# Patient Record
Sex: Female | Born: 1975 | Hispanic: No | Marital: Single | Smoking: Never smoker
Health system: Southern US, Community
[De-identification: ages and names within clinical notes are randomized; demographics above are authoritative.]

## PROBLEM LIST (undated history)

## (undated) DIAGNOSIS — N939 Abnormal uterine and vaginal bleeding, unspecified: Secondary | ICD-10-CM

## (undated) DIAGNOSIS — R002 Palpitations: Secondary | ICD-10-CM

## (undated) DIAGNOSIS — B009 Herpesviral infection, unspecified: Secondary | ICD-10-CM

## (undated) DIAGNOSIS — N83209 Unspecified ovarian cyst, unspecified side: Secondary | ICD-10-CM

## (undated) DIAGNOSIS — F419 Anxiety disorder, unspecified: Secondary | ICD-10-CM

## (undated) DIAGNOSIS — D251 Intramural leiomyoma of uterus: Secondary | ICD-10-CM

## (undated) HISTORY — DX: Abnormal uterine and vaginal bleeding, unspecified: N93.9

## (undated) HISTORY — DX: Intramural leiomyoma of uterus: D25.1

## (undated) HISTORY — DX: Herpesviral infection, unspecified: B00.9

## (undated) HISTORY — DX: Unspecified ovarian cyst, unspecified side: N83.209

---
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
## 2000-02-02 ENCOUNTER — Emergency Department (HOSPITAL_COMMUNITY): Admission: EM | Admit: 2000-02-02 | Discharge: 2000-02-02 | Payer: Self-pay | Admitting: Emergency Medicine

## 2000-02-02 ENCOUNTER — Encounter: Payer: Self-pay | Admitting: Emergency Medicine

## 2008-06-19 ENCOUNTER — Encounter: Admission: RE | Admit: 2008-06-19 | Discharge: 2008-06-19 | Payer: Self-pay | Admitting: Obstetrics and Gynecology

## 2009-11-19 IMAGING — US US SOFT TISSUE HEAD/NECK
1 series · 14 of 25 positions shown · non-contrast
Comparison: None

CLINICAL DATA: Enlarged right lobe of thyroid on physical exam

THYROID ULTRASOUND
TECHNIQUE: Ultrasound examination of the thyroid gland and
adjacent soft tissues was performed.

[Series 1: us soft tissue head/neck · 0.06mm/px · 14 of 35 slices shown]
[im 1/35]
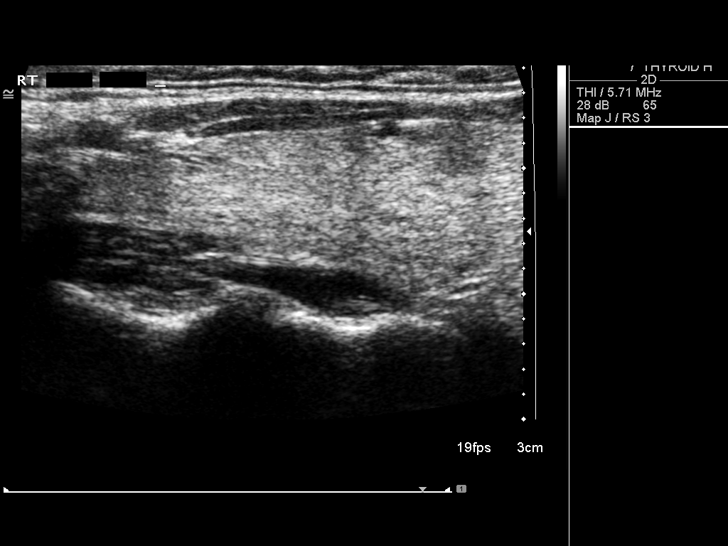
[im 3/35]
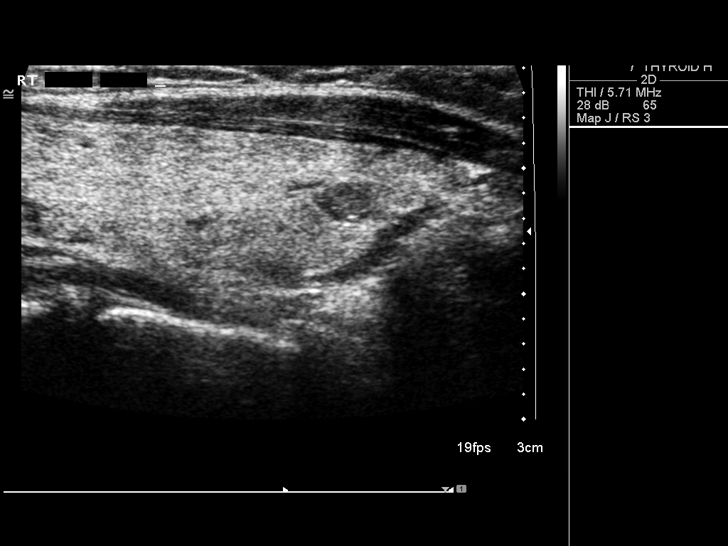
[im 6/35]
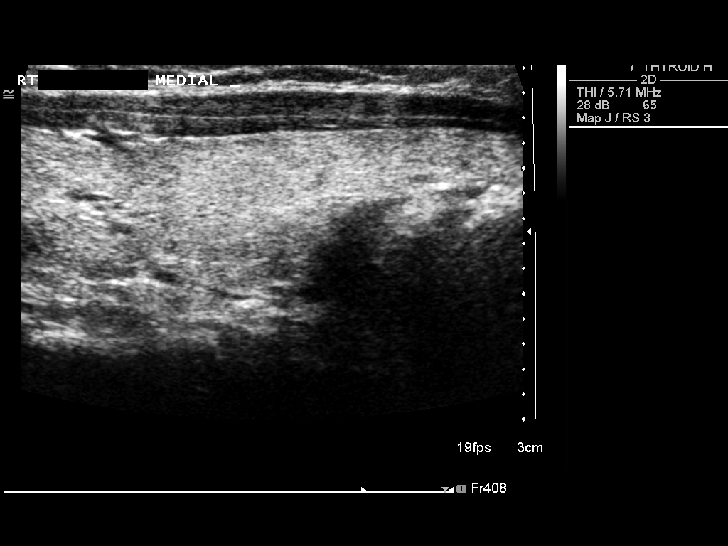
[im 9/35]
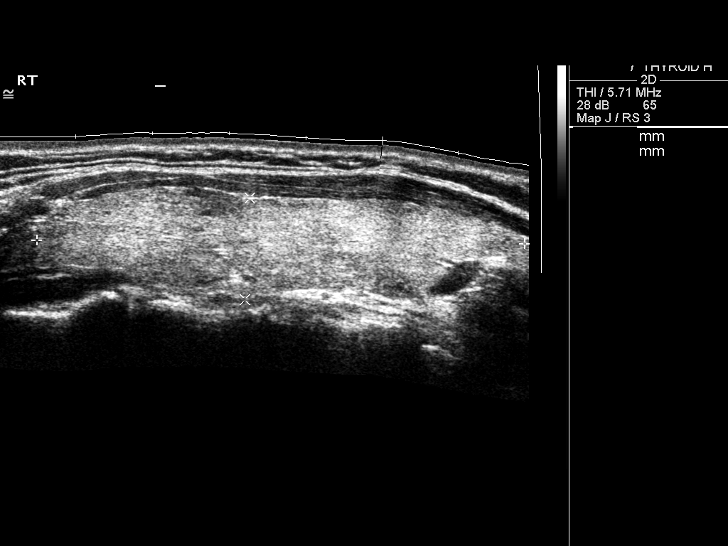
[im 12/35]
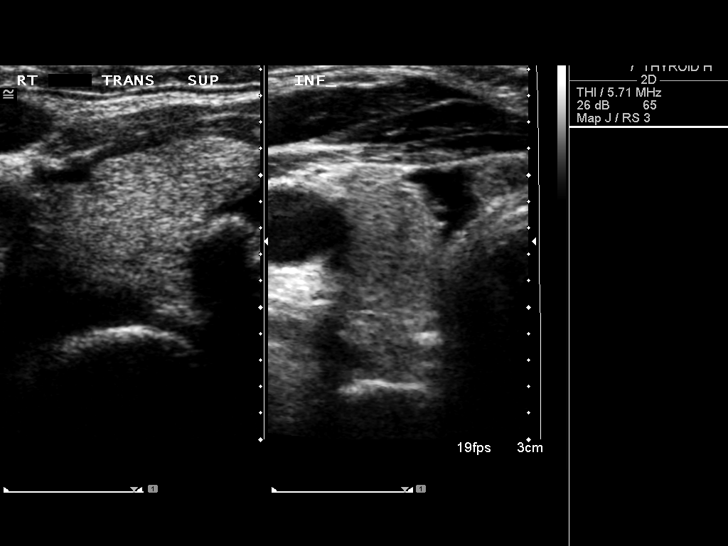
[im 13/35]
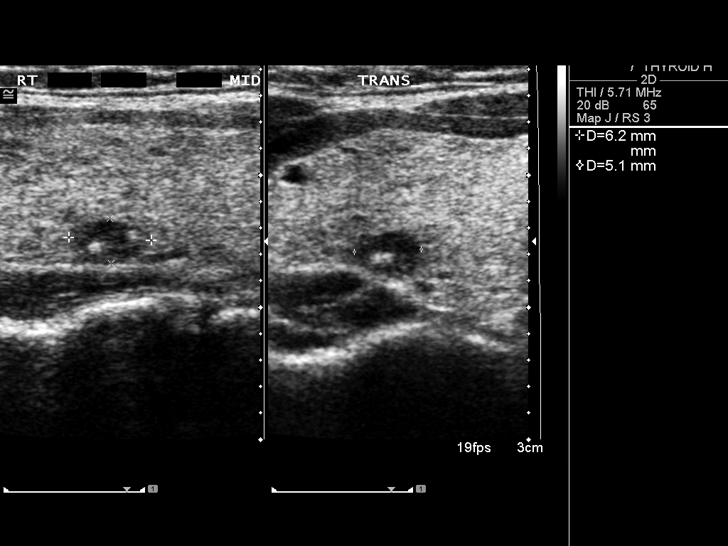
[im 16/35]
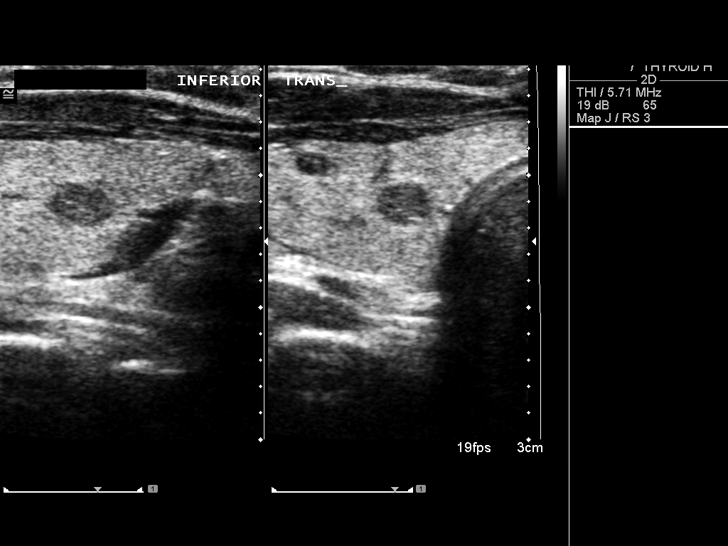
[im 19/35]
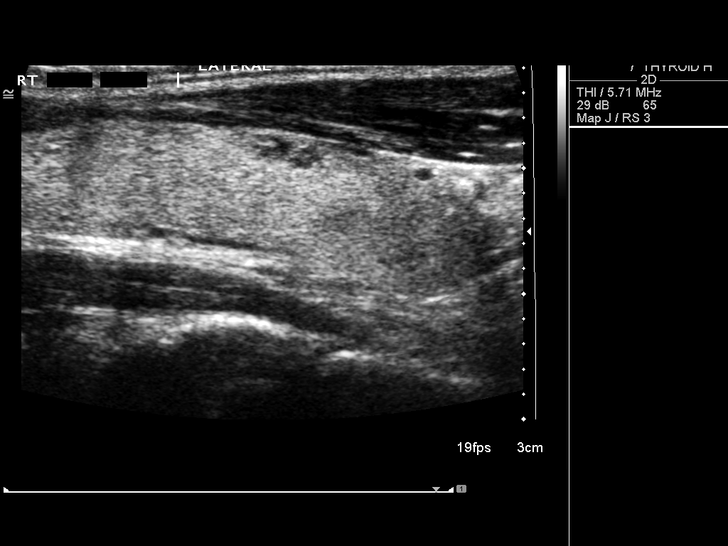
[im 22/35]
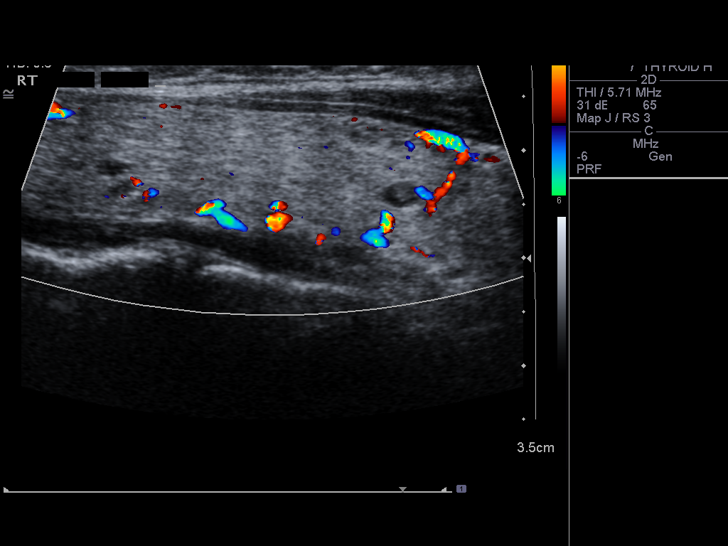
[im 23/35]
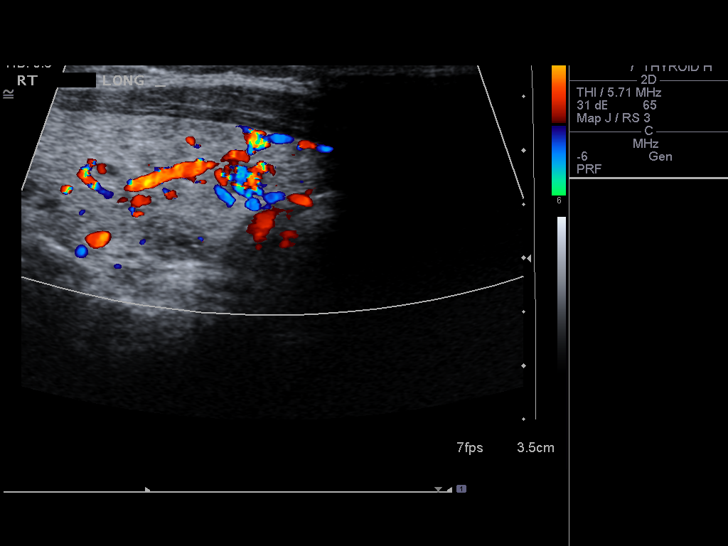
[im 26/35]
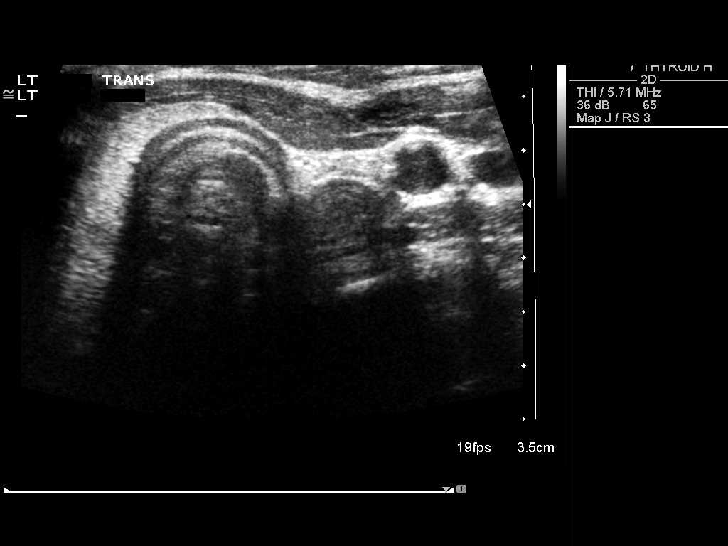
[im 29/35]
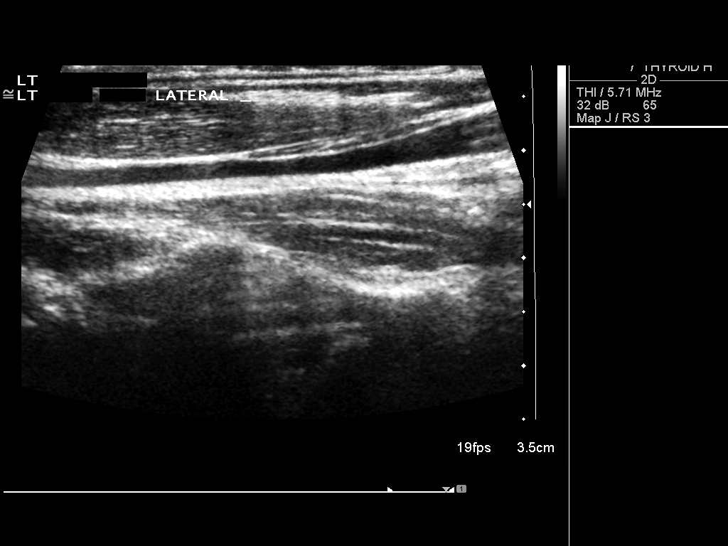
[im 32/35]
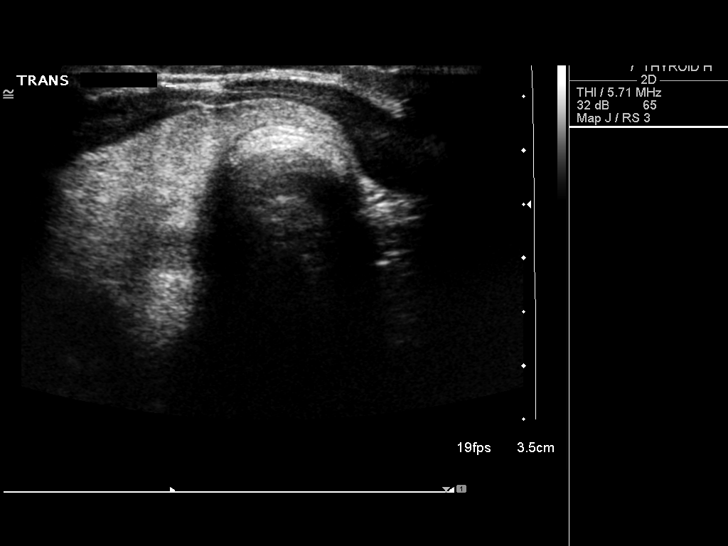
[im 35/35]
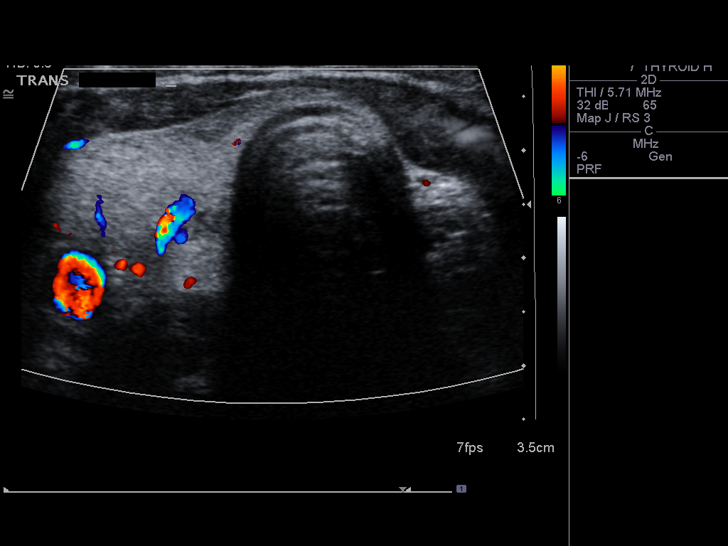

[14 of 25 positions shown; findings below may reference images not displayed]

FINDINGS: No left lobe of thyroid is visible.  By history the
patient said she had cysts removed from the left side as a child
and there is a scar present.  Therefore I assume that left
thyroidectomy was performed.  The right lobe is elongated but
otherwise normal in size measuring 6.4 x 1.3 x 2.2 cm with the
isthmus measuring 2 mm.  Only small hypoechoic nodules are noted on
the right of no more than 7 mm in maximum diameter.
IMPRESSION: Small right thyroid nodules with the right lobe relatively normal
in size.  Probable prior left thyroidectomy.

## 2010-11-01 ENCOUNTER — Telehealth: Payer: Self-pay | Admitting: Cardiology

## 2010-11-01 NOTE — Telephone Encounter (Signed)
LM w/Siobhan and advised her our doctors here have nothing available until October. Will send info to main office at [facility].

## 2010-11-01 NOTE — Telephone Encounter (Signed)
Received request from [facility] via fax to schedule pt ASAP on cover sheet, please determine if Sept is too late to get pt in for a consult based on symptoms, placed request in Dr. Elvis Coil box from [employer] for Megan Rios with HNP 4th page

## 2010-11-03 ENCOUNTER — Telehealth: Payer: Self-pay

## 2010-11-10 ENCOUNTER — Encounter (INDEPENDENT_AMBULATORY_CARE_PROVIDER_SITE_OTHER): Payer: 59

## 2010-11-10 DIAGNOSIS — R002 Palpitations: Secondary | ICD-10-CM

## 2010-11-10 NOTE — Telephone Encounter (Signed)
Monitor was put on today

## 2010-11-24 ENCOUNTER — Telehealth: Payer: Self-pay | Admitting: Internal Medicine

## 2010-11-24 NOTE — Telephone Encounter (Signed)
Test results

## 2010-11-25 NOTE — Telephone Encounter (Signed)
LM with pt.  Will call with results when they are back.

## 2010-12-01 NOTE — Telephone Encounter (Signed)
N/A.  LMTC. 

## 2010-12-02 NOTE — Telephone Encounter (Signed)
Pt returning call to Debbie L.  After 1pm call pt at: [phone]

## 2010-12-21 MED ORDER — METOPROLOL TARTRATE 25 MG PO TABS: 25.0000 mg | ORAL_TABLET | Freq: Two times a day (BID) | ORAL | Status: DC

## 2010-12-21 NOTE — Telephone Encounter (Signed)
Dr Gala Romney has reviewed pt's monitor which shows ST with rates up to 160, he recommends pt start Lopressor 25 mg bid, check thyroid panel and cbc and schedule appt with him.  Pt is aware she is a little hesitant at this point to start med and is unsure about appt time, did go ahead and send in rx to her pharmacy and she will call me back to schedule labs and appt. She request results be faxed to her at [phone] and this was done

## 2010-12-22 ENCOUNTER — Telehealth (HOSPITAL_COMMUNITY): Payer: Self-pay | Admitting: *Deleted

## 2010-12-22 NOTE — Telephone Encounter (Signed)
appt 10/8, she faxed me labs that she had done the end of Aug.

## 2010-12-22 NOTE — Telephone Encounter (Signed)
Pt called this am and set up appt.

## 2011-01-02 ENCOUNTER — Encounter (HOSPITAL_COMMUNITY): Payer: Self-pay

## 2011-01-02 ENCOUNTER — Other Ambulatory Visit: Payer: Self-pay

## 2011-01-02 ENCOUNTER — Ambulatory Visit (HOSPITAL_COMMUNITY)
Admission: RE | Admit: 2011-01-02 | Discharge: 2011-01-02 | Disposition: A | Discharge status: Home / Self Care | Payer: 59 | Source: Ambulatory Visit | Attending: Internal Medicine | Admitting: Internal Medicine

## 2011-01-02 ENCOUNTER — Encounter: Payer: Self-pay | Admitting: Internal Medicine

## 2011-01-02 VITALS — BP 127/82 | HR 86 | Ht 68.0 in | Wt 133.5 lb

## 2011-01-02 DIAGNOSIS — R0989 Other specified symptoms and signs involving the circulatory and respiratory systems: Secondary | ICD-10-CM | POA: Insufficient documentation

## 2011-01-02 DIAGNOSIS — R0609 Other forms of dyspnea: Secondary | ICD-10-CM | POA: Insufficient documentation

## 2011-01-02 DIAGNOSIS — F411 Generalized anxiety disorder: Secondary | ICD-10-CM | POA: Insufficient documentation

## 2011-01-02 DIAGNOSIS — R002 Palpitations: Secondary | ICD-10-CM | POA: Insufficient documentation

## 2011-01-02 DIAGNOSIS — Z79899 Other long term (current) drug therapy: Secondary | ICD-10-CM | POA: Insufficient documentation

## 2011-01-02 HISTORY — DX: Palpitations: R00.2

## 2011-01-02 HISTORY — DX: Anxiety disorder, unspecified: F41.9

## 2011-01-02 NOTE — Patient Instructions (Addendum)
Your physician has requested that you have an echocardiogram. Echocardiography is a painless test that uses sound waves to create images of your heart. It provides your doctor with information about the size and shape of your heart and how well your heart's chambers and valves are working. This procedure takes approximately one hour. There are no restrictions for this procedure.   

## 2011-01-02 NOTE — Progress Notes (Signed)
HPI:  Megan Rios is a 35 y/o woman referred for f/u of palpitations.   She denies any significant PMHx except for anxiety and h/o thyroid cyst s/p left thyroidectomy at age 27.  No HTN, DM2 or known cardiac issues.   Says she has palpitations for a long time and happens a few times per year. Denies any known triggers. Recently seen at Urgent Care with recurrent palpitations. We placed a monitor for 48 hours. All sinus rhythm with avg rate of 85. HR did go up to 160 during exercise. HR up to 130-140 with light activity like vacuuming. Night HRs in 60s.  Now back to work and palpitations better. Does notice that HR goes up easily. Occasional dyspnea. Trying to avoid caffeine. Having trouble sleeping. Under routine stress. No syncope or presyncope. No FHX SCD.  Labs including hgb, electrolytes and TSH are normal. Thyroid u/s with few cysts (s/p thyroidectomy)   ROS: All systems negative except as listed in HPI, PMH and Problem List.  Past Medical History  Diagnosis Date  . Palpitations   . Anxiety     Current Outpatient Prescriptions  Medication Sig Dispense Refill  . cyclobenzaprine (FLEXERIL) 5 MG tablet Take 5 mg by mouth 3 (three) times daily as needed.        Marland Kitchen ibuprofen (ADVIL,MOTRIN) 800 MG tablet Take 800 mg by mouth every 8 (eight) hours as needed.        . loratadine (CLARITIN) 10 MG tablet Take 10 mg by mouth daily.        . norgestrel-ethinyl estradiol (LO/OVRAL,CRYSELLE) 0.3-30 MG-MCG tablet Take 1 tablet by mouth daily.        . metoprolol tartrate (LOPRESSOR) 25 MG tablet Take 1 tablet (25 mg total) by mouth 2 (two) times daily.  60 tablet  3     PHYSICAL EXAM: Filed Vitals:   01/02/11 1002  BP: 127/82  Pulse: 86   General:  Fit appearing. No resp difficulty HEENT: normal Neck: supple. JVP flat. Carotids 2+ bilaterally; no bruits. No lymphadenopathy or thryomegaly appreciated. Cor: PMI normal. Regular rate & rhythm. No rubs, gallops or murmurs. Lungs: clear Abdomen:  soft, nontender, nondistended. No hepatosplenomegaly. No bruits or masses. Good bowel sounds. Extremities: no cyanosis, clubbing, rash, edema Neuro: alert & orientedx3, cranial nerves grossly intact. Moves all 4 extremities w/o difficulty. Affect pleasant.    ECG: NSR 83. Normal axis and intervals. No ST-T wave abnormalities.     ASSESSMENT & PLAN:

## 2011-01-02 NOTE — Assessment & Plan Note (Signed)
We reviewed monitor in depth and no abnormal rhythms. I suspect she just has brisk HR response to activity and possible high adrenergic state. Reassured her and reinforced need to avoid caffeine and other stimulants. Will check echo to make sure she has structurally normal heart. Can f/u PRN. Would not start b-blocker at this point. Encouraged participating in regular exercise training.

## 2011-01-12 ENCOUNTER — Ambulatory Visit (HOSPITAL_COMMUNITY)
Admission: RE | Admit: 2011-01-12 | Discharge: 2011-01-12 | Disposition: A | Discharge status: Home / Self Care | Payer: 59 | Source: Ambulatory Visit | Attending: Internal Medicine | Admitting: Internal Medicine

## 2011-01-12 DIAGNOSIS — R002 Palpitations: Secondary | ICD-10-CM | POA: Insufficient documentation

## 2011-02-02 ENCOUNTER — Telehealth (HOSPITAL_COMMUNITY): Payer: Self-pay | Admitting: *Deleted

## 2011-02-02 NOTE — Telephone Encounter (Signed)
Megan Rios called this am, she would like to know the results of her echo that she had a few weeks ago.

## 2011-02-03 NOTE — Telephone Encounter (Signed)
Am trying to locate results

## 2011-02-09 ENCOUNTER — Telehealth (HOSPITAL_COMMUNITY): Payer: Self-pay | Admitting: *Deleted

## 2011-02-09 NOTE — Telephone Encounter (Signed)
Megan Rios called again today wanting to know the results of her echo. She will be available tomorrow afternoon for you to call her back.

## 2011-02-13 NOTE — Telephone Encounter (Signed)
See phone note 11/15

## 2011-02-13 NOTE — Telephone Encounter (Signed)
Echo department is working on getting me results, pt did have test done however results are not in epic or e-chart

## 2011-02-24 ENCOUNTER — Encounter: Payer: Self-pay | Admitting: Internal Medicine

## 2011-03-06 ENCOUNTER — Telehealth (HOSPITAL_COMMUNITY): Payer: Self-pay | Admitting: *Deleted

## 2011-03-06 NOTE — Telephone Encounter (Signed)
Ms Jane called today regarding her echo she had back in October.  She would like you to call her.  I did some research on her echo, and found the results, that I will give a copy to you.

## 2011-03-08 NOTE — Telephone Encounter (Signed)
Have left pt messages to call back for results

## 2011-03-08 NOTE — Telephone Encounter (Signed)
Pt given echo results and copy faxed to her at [phone]

## 2011-03-13 ENCOUNTER — Telehealth (HOSPITAL_COMMUNITY): Payer: Self-pay | Admitting: *Deleted

## 2011-03-13 NOTE — Telephone Encounter (Signed)
Ms Livengood called today, she is concerned about her recent results of her echo cardiogram and would like to discuss it more in length. Thank you.

## 2011-03-16 ENCOUNTER — Telehealth (HOSPITAL_COMMUNITY): Payer: Self-pay | Admitting: *Deleted

## 2011-03-16 NOTE — Telephone Encounter (Signed)
Megan Rios called today regarding her echo, please call her back

## 2011-03-16 NOTE — Telephone Encounter (Signed)
Have spoken w/pt and she wishes to speak w/Dr Bensimhon about her echo results

## 2011-04-05 NOTE — Telephone Encounter (Signed)
Dr Gala Romney discussed echo results w/pt via phone

## 2012-11-20 ENCOUNTER — Emergency Department (HOSPITAL_COMMUNITY): Payer: 59

## 2012-11-20 ENCOUNTER — Emergency Department (HOSPITAL_COMMUNITY)
Admission: EM | Admit: 2012-11-20 | Discharge: 2012-11-20 | Disposition: A | Discharge status: Home / Self Care | Payer: 59 | Attending: Emergency Medicine | Admitting: Emergency Medicine

## 2012-11-20 ENCOUNTER — Encounter (HOSPITAL_COMMUNITY): Payer: Self-pay | Admitting: Emergency Medicine

## 2012-11-20 DIAGNOSIS — R42 Dizziness and giddiness: Secondary | ICD-10-CM | POA: Insufficient documentation

## 2012-11-20 DIAGNOSIS — Z7982 Long term (current) use of aspirin: Secondary | ICD-10-CM | POA: Insufficient documentation

## 2012-11-20 DIAGNOSIS — R110 Nausea: Secondary | ICD-10-CM | POA: Insufficient documentation

## 2012-11-20 DIAGNOSIS — Z3202 Encounter for pregnancy test, result negative: Secondary | ICD-10-CM | POA: Insufficient documentation

## 2012-11-20 DIAGNOSIS — R5381 Other malaise: Secondary | ICD-10-CM | POA: Insufficient documentation

## 2012-11-20 DIAGNOSIS — F411 Generalized anxiety disorder: Secondary | ICD-10-CM | POA: Insufficient documentation

## 2012-11-20 LAB — POCT I-STAT TROPONIN I
Troponin i, poc: 0.01 ng/mL (ref 0.00–0.08)
Troponin i, poc: 0 ng/mL (ref 0.00–0.08)

## 2012-11-20 LAB — BASIC METABOLIC PANEL
CO2: 27 mEq/L (ref 19–32)
Calcium: 9.6 mg/dL (ref 8.4–10.5)
Sodium: 137 mEq/L (ref 135–145)

## 2012-11-20 LAB — CBC
MCH: 32.1 pg (ref 26.0–34.0)
Platelets: 199 10*3/uL (ref 150–400)
RBC: 4.61 MIL/uL (ref 3.87–5.11)
WBC: 7.7 10*3/uL (ref 4.0–10.5)

## 2012-11-20 MED ORDER — MECLIZINE HCL 25 MG PO TABS: 25.0000 mg | ORAL_TABLET | Freq: Four times a day (QID) | ORAL | Status: DC

## 2012-11-20 MED ORDER — ONDANSETRON HCL 4 MG/2ML IJ SOLN
4.0000 mg | Freq: Once | INTRAMUSCULAR | Status: AC
  Administered 2012-11-20: 4 mg via INTRAVENOUS
  Filled 2012-11-20: qty 2

## 2012-11-20 MED ORDER — SODIUM CHLORIDE 0.9 % IV BOLUS (SEPSIS)
1000.0000 mL | Freq: Once | INTRAVENOUS | Status: AC
  Administered 2012-11-20: 1000 mL via INTRAVENOUS

## 2012-11-20 MED ORDER — MECLIZINE HCL 25 MG PO TABS
25.0000 mg | ORAL_TABLET | Freq: Once | ORAL | Status: AC
  Administered 2012-11-20: 25 mg via ORAL
  Filled 2012-11-20: qty 1

## 2012-11-20 NOTE — ED Notes (Signed)
Waiting on bolus to finish 

## 2012-11-20 NOTE — ED Provider Notes (Signed)
CSN: [id number]     Arrival date & time 11/20/12  1015 History   First MD Initiated Contact with Patient 11/20/12 1107     Chief Complaint  Patient presents with  . Chest Pain  . Dizziness    HPI   Patient goes by Megan Rios. She describes 3 days of just not feeling normal. She has stress week and her grandmother was in the hospital with a stroke her son broke his arms is in the hospital with him shortness to work on Monday not feeling well. Today is Wednesday. She states she felt like she was a little floating in her head. Had some nausea. She felt "spacey". Was able to take her blood pressure checked work it was high. She went home she rested Monday night. Tuesday she felt the same. They checked her blood pressure today on Tuesday she states it was high most of the day again. She waking this morning. Whenever she moves her chest in her bed she collecting shifter move around her. She states she's had vertigo in the past and this is not nearly as severe but somewhat similar.  She is on birth control. She took a pregnancy test 3 days ago because she missed today's. It was negative. Past Medical History  Diagnosis Date  . Palpitations   . Anxiety    Past Surgical History  Procedure Laterality Date  . Thyroid cyst excision  1981   Family History  Problem Relation Age of Onset  . Stroke Maternal Grandmother   . Diabetes Maternal Grandmother   . Diabetes Father   . Diabetes Maternal Grandfather   . Stroke Paternal Grandmother   . Stroke Paternal Grandfather    History  Substance Use Topics  . Smoking status: Never Smoker   . Smokeless tobacco: Not on file  . Alcohol Use: 0.5 oz/week    1 drink(s) per week   OB History   Grav Para Term Preterm Abortions TAB SAB Ect Mult Living                 Review of Systems  Constitutional: Negative for fever, chills, diaphoresis, appetite change and fatigue.  HENT: Negative for sore throat, mouth sores and trouble swallowing.   Eyes: Negative for  visual disturbance.  Respiratory: Positive for chest tightness. Negative for cough, shortness of breath and wheezing.   Cardiovascular: Negative for chest pain.  Gastrointestinal: Positive for nausea. Negative for vomiting, abdominal pain, diarrhea and abdominal distention.  Endocrine: Negative for polydipsia, polyphagia and polyuria.  Genitourinary: Negative for dysuria, frequency and hematuria.  Musculoskeletal: Negative for gait problem.  Skin: Negative for color change, pallor and rash.  Neurological: Positive for dizziness and weakness. Negative for syncope, light-headedness and headaches.       Feeling that she is moving around  Hematological: Does not bruise/bleed easily.  Psychiatric/Behavioral: Negative for behavioral problems and confusion.    Allergies  Review of patient's allergies indicates no known allergies.  Home Medications   Current Outpatient Rx  Name  Route  Sig  Dispense  Refill  . aspirin 325 MG tablet   Oral   Take 325 mg by mouth once.         . cetirizine (ZYRTEC) 10 MG tablet   Oral   Take 10 mg by mouth daily as needed for allergies.         Marland Kitchen ibuprofen (ADVIL,MOTRIN) 800 MG tablet   Oral   Take 800 mg by mouth every 8 (eight) hours as needed  for pain.          . Multiple Vitamins-Minerals (MULTIVITAMIN WITH MINERALS) tablet   Oral   Take 1 tablet by mouth daily.         . norgestrel-ethinyl estradiol (LO/OVRAL,CRYSELLE) 0.3-30 MG-MCG tablet   Oral   Take 1 tablet by mouth daily.          . meclizine (ANTIVERT) 25 MG tablet   Oral   Take 1 tablet (25 mg total) by mouth 4 (four) times daily.   28 tablet   0    BP 125/82  Pulse 79  Temp(Src) 98.2 F (36.8 C) (Oral)  Resp 22  SpO2 100%  LMP 08/14/2012 Physical Exam  Constitutional: She is oriented to person, place, and time. She appears well-developed and well-nourished. No distress.  HENT:  Head: Normocephalic.  Eyes: Conjunctivae are normal. Pupils are equal, round, and  reactive to light. No scleral icterus.  No nystagmus but with head movements or body movements or position changes she states she feels like things are moving  Neck: Normal range of motion. Neck supple. No thyromegaly present.  Cardiovascular: Normal rate and regular rhythm.  Exam reveals no gallop and no friction rub.   No murmur heard. Regular. No murmurs.  Pulmonary/Chest: Effort normal and breath sounds normal. No respiratory distress. She has no wheezes. She has no rales.  Abdominal: Soft. Bowel sounds are normal. She exhibits no distension. There is no tenderness. There is no rebound.  Musculoskeletal: Normal range of motion.  Neurological: She is alert and oriented to person, place, and time.  No nystagmus. Intact cranial nerves. Normal gait.  Skin: Skin is warm and dry. No rash noted.  Psychiatric: She has a normal mood and affect. Her behavior is normal.    ED Course  Procedures (including critical care time) Labs Review Labs Reviewed  CBC  BASIC METABOLIC PANEL  HCG, SERUM, QUALITATIVE  POCT I-STAT TROPONIN I  POCT PREGNANCY, URINE  POCT I-STAT TROPONIN I   EKG: Indication dizziness or patient's sinus tachycardia rate of 101. No acute ischemic changes no injury or ectopy  Imaging Review Dg Chest 2 View  11/20/2012   *RADIOLOGY REPORT*  Clinical Data: Mid and left chest pain.  CHEST - 2 VIEW  Comparison: None.  Findings: Lungs are clear.  No pneumothorax pleural fluid.  No focal bony abnormality.  IMPRESSION: Negative chest.   Original Report Authenticated By: Holley Dexter, M.D.    MDM   1. Vertigo    Citrucel and vertigo. Cultures is not pregnant. Her left lites. Fluids. Will see the heart rate comes down. No ischemic changes on EKG. Is a supple tachycardia. This may be somewhat to the poor by mouth intake. Doubt MI doubt PE.   Heart rate improved with fluids. Slightly somnolent from the meclizine for her vertigo is resolved. Not hypoxemic. Not tachycardic. Labs  reassuring. Not pregnant. Diagnosis is vertigo. Plan is expectant management Antivert as needed    Claudean Kinds, MD 11/20/12 1328

## 2012-11-20 NOTE — ED Notes (Signed)
Pt c/o mid sternal CP and nausea with dizziness x 3 days

## 2013-05-28 ENCOUNTER — Other Ambulatory Visit: Payer: Self-pay | Admitting: Certified Nurse Midwife

## 2013-05-28 ENCOUNTER — Encounter: Payer: Self-pay | Admitting: Certified Nurse Midwife

## 2013-05-28 DIAGNOSIS — R0989 Other specified symptoms and signs involving the circulatory and respiratory systems: Secondary | ICD-10-CM

## 2013-05-28 DIAGNOSIS — R198 Other specified symptoms and signs involving the digestive system and abdomen: Principal | ICD-10-CM

## 2013-05-28 DIAGNOSIS — B379 Candidiasis, unspecified: Secondary | ICD-10-CM

## 2013-05-28 MED ORDER — FLUCONAZOLE 150 MG PO TABS: 150.0000 mg | ORAL_TABLET | Freq: Once | ORAL | Status: DC

## 2013-05-28 MED ORDER — AMPICILLIN 500 MG PO CAPS: 500.0000 mg | ORAL_CAPSULE | Freq: Four times a day (QID) | ORAL | Status: DC

## 2013-08-04 ENCOUNTER — Other Ambulatory Visit: Payer: Self-pay | Admitting: Nurse Practitioner

## 2013-08-04 MED ORDER — AZITHROMYCIN 250 MG PO TABS: 250.0000 mg | ORAL_TABLET | Freq: Once | ORAL | Status: DC

## 2013-08-04 MED ORDER — NITROFURANTOIN MONOHYD MACRO 100 MG PO CAPS: 100.0000 mg | ORAL_CAPSULE | Freq: Two times a day (BID) | ORAL | Status: DC

## 2013-08-04 MED ORDER — FLUCONAZOLE 150 MG PO TABS: 150.0000 mg | ORAL_TABLET | Freq: Once | ORAL | Status: DC

## 2013-12-15 ENCOUNTER — Emergency Department
Admission: EM | Admit: 2013-12-15 | Discharge: 2013-12-15 | Disposition: A | Discharge status: Home / Self Care | Payer: 59 | Source: Home / Self Care | Attending: Emergency Medicine | Admitting: Emergency Medicine

## 2013-12-15 ENCOUNTER — Encounter: Payer: Self-pay | Admitting: Emergency Medicine

## 2013-12-15 DIAGNOSIS — T169XXA Foreign body in ear, unspecified ear, initial encounter: Secondary | ICD-10-CM

## 2013-12-15 DIAGNOSIS — T161XXA Foreign body in right ear, initial encounter: Secondary | ICD-10-CM

## 2013-12-15 DIAGNOSIS — J301 Allergic rhinitis due to pollen: Secondary | ICD-10-CM

## 2013-12-15 DIAGNOSIS — H9201 Otalgia, right ear: Secondary | ICD-10-CM

## 2013-12-15 DIAGNOSIS — H9209 Otalgia, unspecified ear: Secondary | ICD-10-CM

## 2013-12-15 MED ORDER — FLUTICASONE PROPIONATE 50 MCG/ACT NA SUSP: NASAL | Status: DC

## 2013-12-15 NOTE — ED Notes (Signed)
Pt c/o RT ear pain x 2 days. Denies fever. She has taken Mucinex and Allergy med.

## 2013-12-15 NOTE — ED Provider Notes (Signed)
CSN: [id number]     Arrival date & time 12/15/13  1813 History   First MD Initiated Contact with Patient 12/15/13 1813     Chief Complaint  Patient presents with  . Otalgia   (Consider location/radiation/quality/duration/timing/severity/associated sxs/prior Treatment) HPI Worsening dull and sharp right ear pain for 2 days. It started as mild right ear fullness, then she used a Q-tip 2 days ago and it severely exacerbated the right ear pain and she had decreased hearing associated. Scant amount of blood on the Q-tip but otherwise no drainage or bleeding. She feels mild seasonal allergy flareup with mild sinus congestion, clear rhinorrhea and stopped up ears. No sore throat or discolored rhinorrhea. No facial pain. No cough or shortness of breath or wheezing or chest pain. No GI or GU symptoms.  She has tried Mucinex and OTC Claritin without significant relief. Past Medical History  Diagnosis Date  . Palpitations   . Anxiety    Past Surgical History  Procedure Laterality Date  . Thyroid cyst excision  1981   Family History  Problem Relation Age of Onset  . Stroke Maternal Grandmother   . Diabetes Maternal Grandmother   . Hypertension Maternal Grandmother   . Diabetes Father   . Hypertension Father   . Stroke Paternal Grandmother   . Hypertension Paternal Grandmother   . Diabetes Paternal Grandfather   . Heart attack Paternal Grandfather   . Hypertension Paternal Grandfather    History  Substance Use Topics  . Smoking status: Never Smoker   . Smokeless tobacco: Not on file  . Alcohol Use: 0.5 oz/week    1 drink(s) per week   OB History   Grav Para Term Preterm Abortions TAB SAB Ect Mult Living   0 0 0 0 0 0 0 0 0 0      Review of Systems  All other systems reviewed and are negative.   Allergies  Review of patient's allergies indicates no known allergies.  Home Medications   Prior to Admission medications   Medication Sig Start Date End Date Taking? Authorizing  Provider  loratadine (CLARITIN) 10 MG tablet Take 10 mg by mouth daily.   Yes Historical Provider, MD  fluticasone Asencion Islam) 50 MCG/ACT nasal spray 1 or 2 sprays each nostril twice a day 12/15/13   Jacqulyn Cane, MD  ibuprofen (ADVIL,MOTRIN) 800 MG tablet Take 800 mg by mouth every 8 (eight) hours as needed for pain.     Historical Provider, MD  Multiple Vitamins-Minerals (MULTIVITAMIN WITH MINERALS) tablet Take 1 tablet by mouth daily.    Historical Provider, MD  Norethin-Eth Estrad-Fe Biphas (LO LOESTRIN FE PO) Take 1 mg by mouth.    Historical Provider, MD  Probiotic Product (PROBIOTIC PO) Take 1 tablet by mouth daily as needed.    Historical Provider, MD  vitamin B-12 (CYANOCOBALAMIN) 1000 MCG tablet Take 1,000 mcg by mouth daily.    Historical Provider, MD   BP 122/87  Pulse 83  Temp(Src) 98.2 F (36.8 C) (Oral)  Resp 16  Ht 5' 8.75" (1.746 m)  Wt 145 lb (65.772 kg)  BMI 21.58 kg/m2  SpO2 100%  LMP 11/28/2013 Physical Exam  Nursing note and vitals reviewed. Constitutional: She is oriented to person, place, and time. She appears well-developed and well-nourished. No distress.  HENT:  Head: Normocephalic and atraumatic.  Right Ear: External ear and ear canal normal. A middle ear effusion is present. Decreased hearing is noted.  Left Ear: Tympanic membrane, external ear and ear canal normal.  Nose: Mucosal edema and rhinorrhea present.  Mouth/Throat: Oropharynx is clear and moist. No oral lesions.  Right ear: Mildly decreased hearing. No external ear tenderness. Canal patent, minimal injection, but no drainage or discharge. In the canal, near the TM, is wax mixed with cotton fibers. The TM is partially obscured.  Eyes: Conjunctivae are normal. No scleral icterus.  Neck: Neck supple.  Cardiovascular: Normal rate, regular rhythm and normal heart sounds.   Pulmonary/Chest: Effort normal and breath sounds normal.  Lymphadenopathy:    She has no cervical adenopathy.  Neurological: She  is alert and oriented to person, place, and time.  Skin: Skin is warm and dry.    ED Course  Procedures (including critical care time) Labs Review Labs Reviewed - No data to display  Imaging Review No results found.   MDM   1. Acute pain of right ear   2. Foreign body in right ear, initial encounter   3. Allergic rhinitis due to pollen    Treatment options discussed, as well as risks, benefits, alternatives. Patient voiced understanding and agreement with the following plans: Using irrigation, right ear and gentle curettage, the foreign body of wax and Q-tip fibers was removed without side effects or complications.--- Immediately afterward, the discomfort right ear resolved and she could hear normally right ear and she expressed her appreciation.--I reexamined right ear and TM is normal except minimal air fluid level. TM pink with otherwise normal landmarks. No redness or perforation.  Advice given on how to safely clean ears at home. Do not use Q-tips. Other discussion and advice regarding seasonal allergy treatment and treatment of serous otitis media. Flonase OTC antihistamine  Follow-up with your primary care doctor in 5-7 days if not improving, or sooner if symptoms become worse. Precautions discussed. Red flags discussed. Questions invited and answered. Patient voiced understanding and agreement.     Jacqulyn Cane, MD 12/15/13 1945

## 2014-03-03 ENCOUNTER — Other Ambulatory Visit: Payer: Self-pay | Admitting: Certified Nurse Midwife

## 2014-03-03 DIAGNOSIS — N390 Urinary tract infection, site not specified: Secondary | ICD-10-CM

## 2014-03-03 MED ORDER — PHENAZOPYRIDINE HCL 200 MG PO TABS: 200.0000 mg | ORAL_TABLET | Freq: Three times a day (TID) | ORAL | Status: DC | PRN

## 2014-03-03 MED ORDER — NITROFURANTOIN MONOHYD MACRO 100 MG PO CAPS: 100.0000 mg | ORAL_CAPSULE | Freq: Two times a day (BID) | ORAL | Status: DC

## 2014-03-03 NOTE — Progress Notes (Signed)
38 y.o. single african [place] female g0p0 here with complaint of UTI, with onset  on 2 days ago. Patient complaining of urinary frequency/urgency/ and pain with urination. Patient denies fever, chills, nausea or back pain. No new personal products. Patient feels not related to sexual activity. Denies any vaginal symptoms or new personal products. Contraception is OCP. History of UTI in past with same symptoms.  HT. 5'8.75  Wt. 147  BP 100/60  P 80  O: Healthy female WDWN Affect: Normal, orientation x 3 Skin : warm and dry CVAT: negative bilateral Abdomen: positive for suprapubic tenderness  Declines pelvic exam Urine POCT 2+ blood  A: UTI  P: Reviewed finding consistent with that of UTI [id number] see order Declines urine culture Reviewed warning signs and symptoms of UTI and need to increase water intake. Encouraged to limit soda, tea, and coffee  RV prn

## 2014-04-22 IMAGING — CR DG CHEST 2V
2 series · 2 of 2 positions shown · non-contrast
Comparison: None.

CLINICAL DATA: Mid and left chest pain.

CHEST - 2 VIEW

[w chest pa]
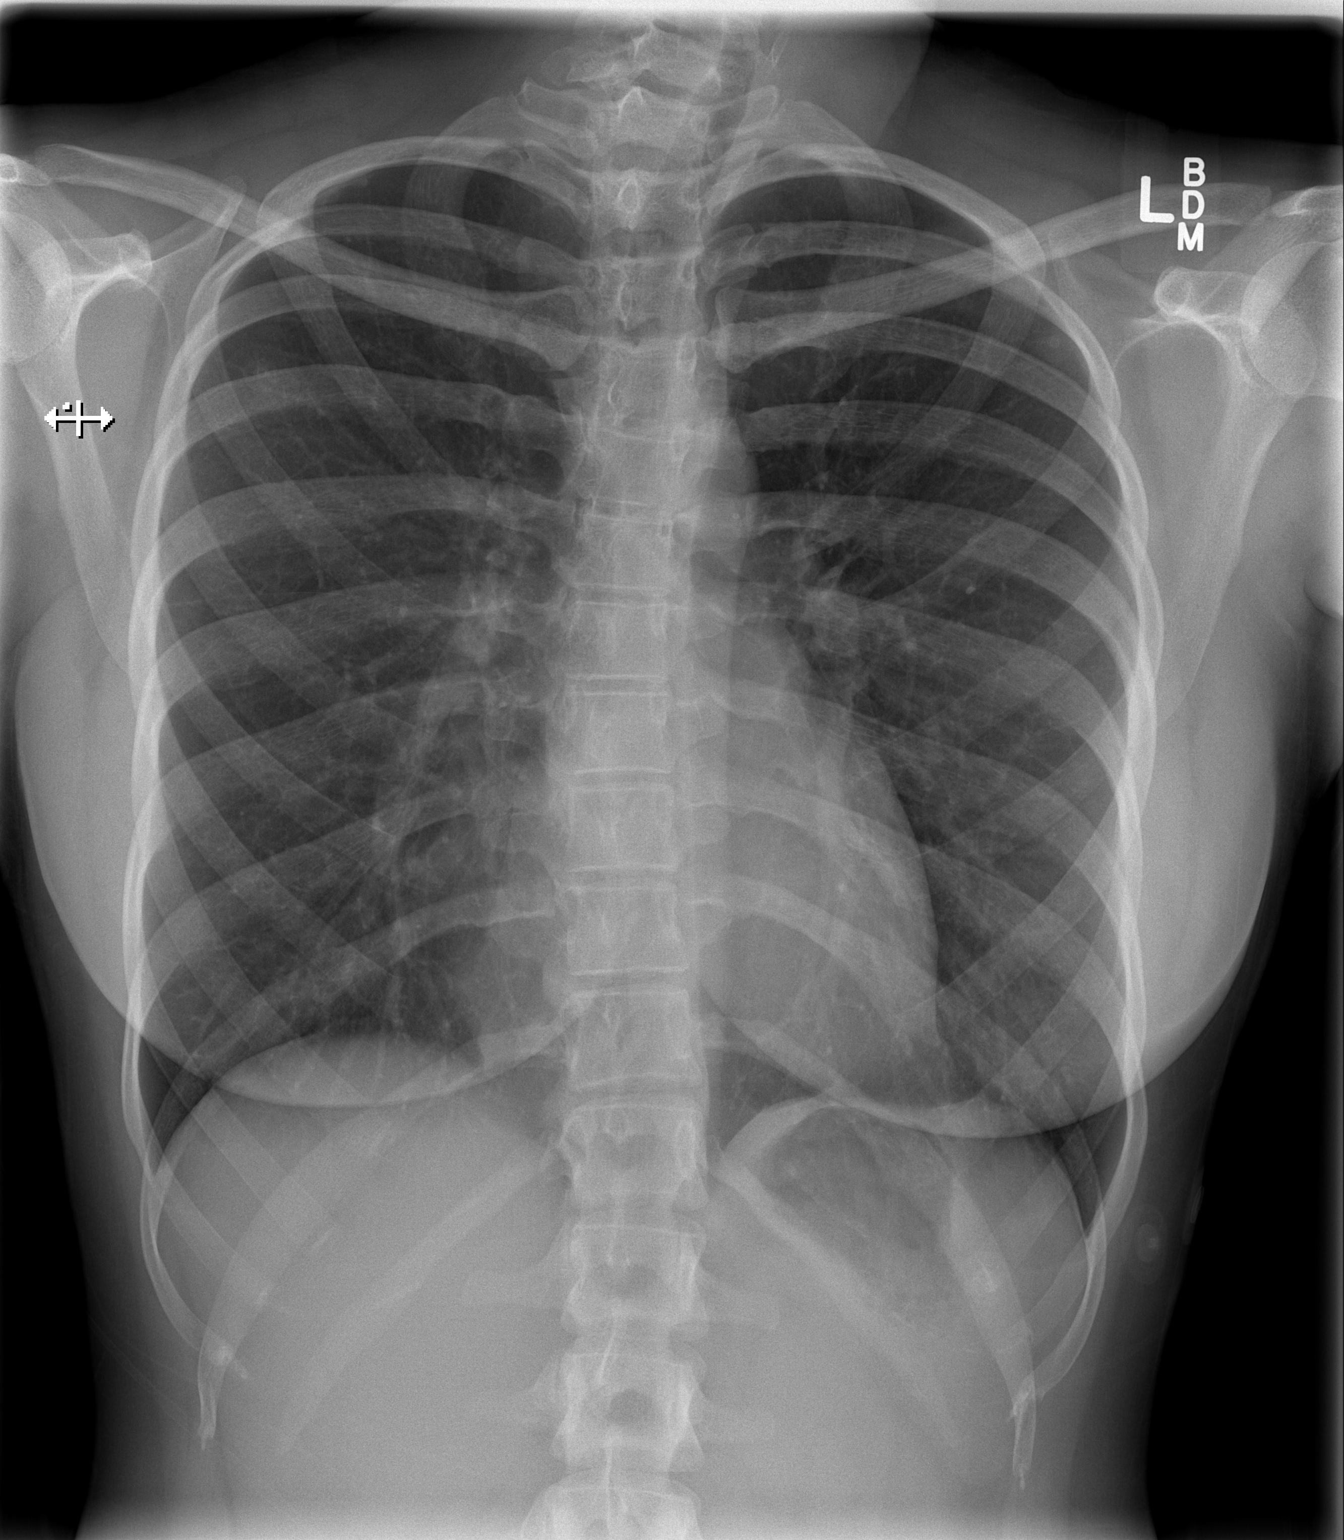

[w chest lat]
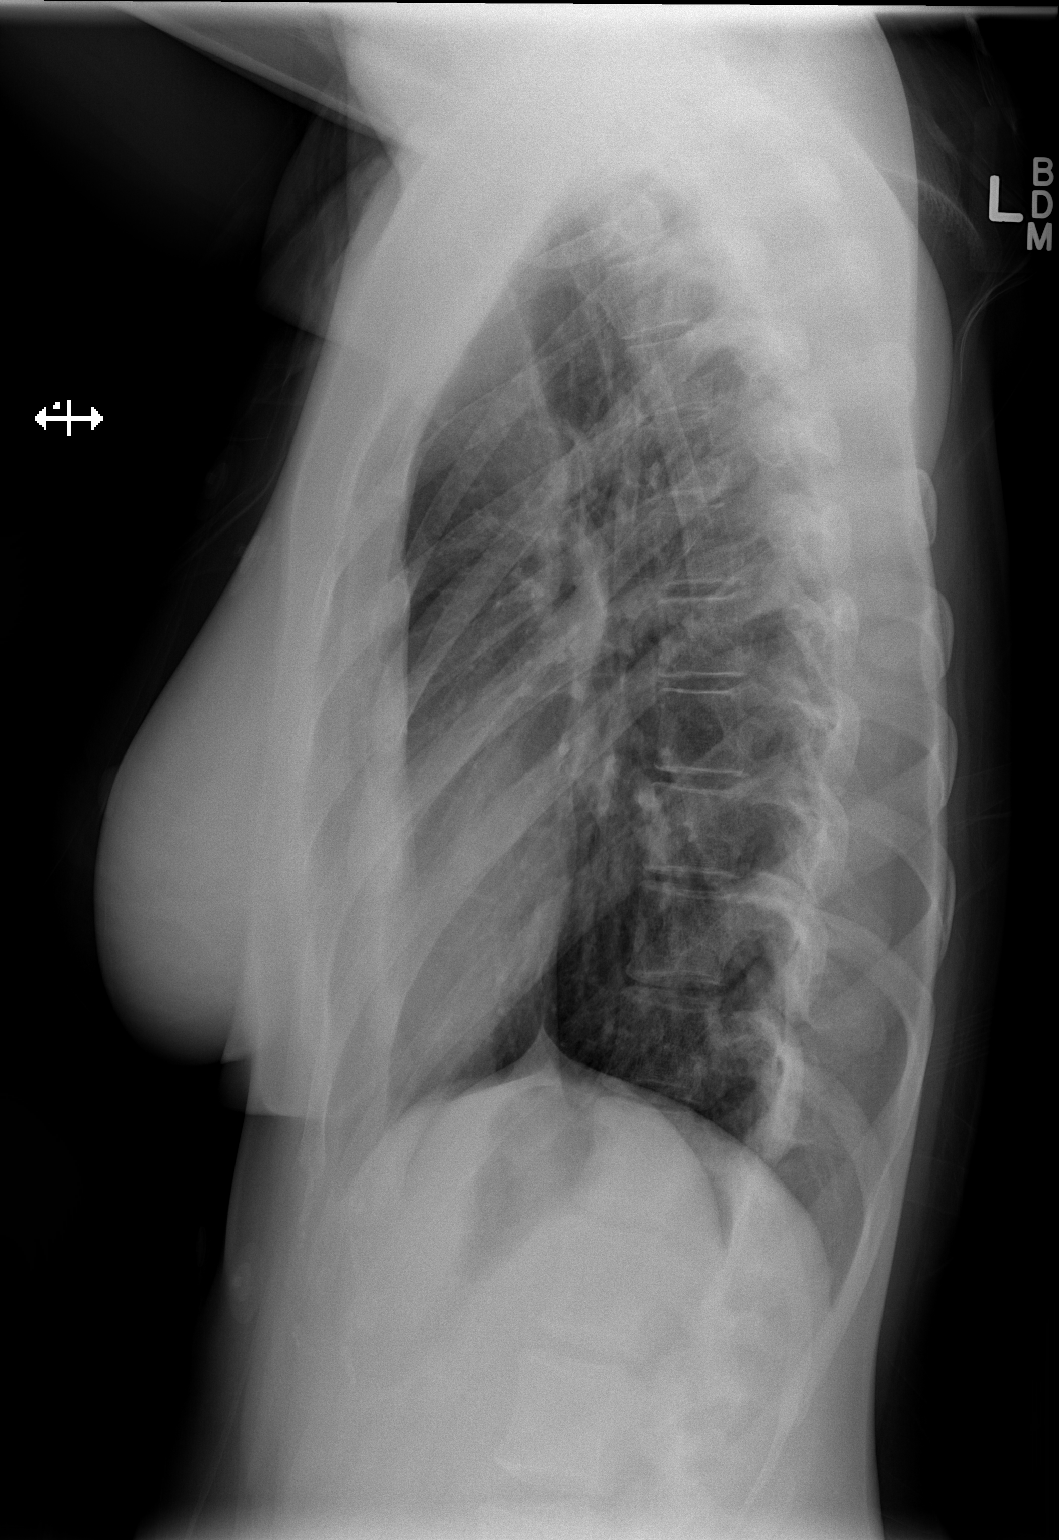

[2 of 2 positions shown; findings below may reference images not displayed]

FINDINGS: Lungs are clear.  No pneumothorax pleural fluid.  No
focal bony abnormality.
IMPRESSION: Negative chest.

## 2014-07-15 ENCOUNTER — Encounter: Payer: Self-pay | Admitting: Certified Nurse Midwife

## 2014-07-15 ENCOUNTER — Other Ambulatory Visit: Payer: Self-pay | Admitting: Certified Nurse Midwife

## 2014-07-15 ENCOUNTER — Other Ambulatory Visit (INDEPENDENT_AMBULATORY_CARE_PROVIDER_SITE_OTHER): Payer: Self-pay | Admitting: Certified Nurse Midwife

## 2014-07-15 ENCOUNTER — Other Ambulatory Visit: Payer: 59

## 2014-07-15 DIAGNOSIS — N390 Urinary tract infection, site not specified: Secondary | ICD-10-CM

## 2014-07-15 MED ORDER — NITROFURANTOIN MONOHYD MACRO 100 MG PO CAPS: 100.0000 mg | ORAL_CAPSULE | Freq: Two times a day (BID) | ORAL | Status: DC

## 2014-07-15 NOTE — Progress Notes (Unsigned)
Patient ID: Megan Rios, female   DOB: 21-May-1975, 39 y.o.   MRN: [id number]  Single african female g0p0 here with complaint of urinary frequency, pressure and occasional bladder spasm. Has had post coital UTI in past, feels this is same idid not have refill on Macrobid to take. Uses Macrobid for treatment with good results. Request same. Denies fever, chills, back pain or nausea. Has noticed increased vaginal discharge but no itching or burning.. No new personal products. Declines exam.  O: Healthy WDWN female in no distress Orientation x 3  A: History of post coital UTI symptomatic  P: UTI Rx Macrobid see order Lab Urine culture. Warnings with UTI given. Increase water intake, limit soda, tea and coffee.  Rv prn

## 2014-07-17 LAB — URINE CULTURE
Colony Count: NO GROWTH
Organism ID, Bacteria: NO GROWTH

## 2015-01-14 ENCOUNTER — Other Ambulatory Visit: Payer: Self-pay | Admitting: Certified Nurse Midwife

## 2015-01-14 DIAGNOSIS — N760 Acute vaginitis: Secondary | ICD-10-CM

## 2015-01-14 DIAGNOSIS — B9689 Other specified bacterial agents as the cause of diseases classified elsewhere: Secondary | ICD-10-CM

## 2015-01-14 MED ORDER — HYLAFEM VA SUPP: 1.0000 | Freq: Every day | VAGINAL | Status: DC

## 2015-02-10 ENCOUNTER — Telehealth: Payer: 59 | Admitting: Nurse Practitioner

## 2015-02-10 DIAGNOSIS — N760 Acute vaginitis: Secondary | ICD-10-CM

## 2015-02-10 DIAGNOSIS — A499 Bacterial infection, unspecified: Secondary | ICD-10-CM

## 2015-02-10 DIAGNOSIS — B9689 Other specified bacterial agents as the cause of diseases classified elsewhere: Secondary | ICD-10-CM

## 2015-02-10 MED ORDER — METRONIDAZOLE 500 MG PO TABS: 500.0000 mg | ORAL_TABLET | Freq: Two times a day (BID) | ORAL | Status: DC

## 2015-02-10 NOTE — Progress Notes (Signed)

## 2015-03-09 ENCOUNTER — Telehealth: Payer: Self-pay | Admitting: Certified Nurse Midwife

## 2015-03-09 DIAGNOSIS — T3695XA Adverse effect of unspecified systemic antibiotic, initial encounter: Principal | ICD-10-CM

## 2015-03-09 DIAGNOSIS — Z8709 Personal history of other diseases of the respiratory system: Secondary | ICD-10-CM

## 2015-03-09 DIAGNOSIS — B379 Candidiasis, unspecified: Secondary | ICD-10-CM

## 2015-03-09 MED ORDER — FLUCONAZOLE 150 MG PO TABS: 150.0000 mg | ORAL_TABLET | Freq: Once | ORAL | Status: DC

## 2015-03-09 MED ORDER — AMOXICILLIN 500 MG PO CAPS: 500.0000 mg | ORAL_CAPSULE | Freq: Three times a day (TID) | ORAL | Status: DC

## 2015-03-09 NOTE — Telephone Encounter (Signed)
Phone call to patient. Complaining of onset of sore throat with redness. History of recurrent tonsillitis chronic, also complaining of yeast associated antibiotic use. Low grade fever, fatigue and sore throat occurring. Request refill on antibiotic and Diflucan with use.  Rx Amoxicillin see order Rx Diflucan see order Patient instructed to see ENT if not resolving or high fever in the next 2-3 days. Patient voiced understanding.

## 2015-03-31 ENCOUNTER — Telehealth: Payer: 59 | Admitting: Family

## 2015-03-31 DIAGNOSIS — A499 Bacterial infection, unspecified: Secondary | ICD-10-CM

## 2015-03-31 DIAGNOSIS — N760 Acute vaginitis: Secondary | ICD-10-CM

## 2015-03-31 DIAGNOSIS — B9689 Other specified bacterial agents as the cause of diseases classified elsewhere: Secondary | ICD-10-CM

## 2015-03-31 MED ORDER — METRONIDAZOLE 0.75 % VA GEL: 1.0000 | Freq: Every day | VAGINAL | Status: DC

## 2015-03-31 NOTE — Progress Notes (Signed)
We are sorry that you are not feeling well. Here is how we plan to help! Based on what you shared with me it looks like you: May have a vaginosis due to bacteria  Vaginosis is an inflammation of the vagina that can result in discharge, itching and pain. The cause is usually a change in the normal balance of vaginal bacteria or an infection. Vaginosis can also result from reduced estrogen levels after menopause.  The most common causes of vaginosis are:   Bacterial vaginosis which results from an overgrowth of one on several organisms that are normally present in your vagina.   Yeast infections which are caused by a naturally occurring fungus called candida.   Vaginal atrophy (atrophic vaginosis) which results from the thinning of the vagina from reduced estrogen levels after menopause.   Trichomoniasis which is caused by a parasite and is commonly transmitted by sexual intercourse.  Factors that increase your risk of developing vaginosis include: Marland Kitchen Medications, such as antibiotics and steroids . Uncontrolled diabetes . Use of hygiene products such as bubble bath, vaginal spray or vaginal deodorant . Douching . Wearing damp or tight-fitting clothing . Using an intrauterine device (IUD) for birth control . Hormonal changes, such as those associated with pregnancy, birth control pills or menopause . Sexual activity . Having a sexually transmitted infection  Your treatment plan is MetroGel Vaginal 1 applicator at bedtime for 5  days.  I have electronically sent this prescription into the pharmacy that you have chosen.  Be sure to take all of the medication as directed. Stop taking any medication if you develop a rash, tongue swelling or shortness of breath. Mothers who are breast feeding should consider pumping and discarding their breast milk while on these antibiotics. However, there is no consensus that infant exposure at these doses would be harmful.  Remember that medication creams can  weaken latex condoms. Marland Kitchen   HOME CARE:  Good hygiene may prevent some types of vaginosis from recurring and may relieve some symptoms:  . Avoid baths, hot tubs and whirlpool spas. Rinse soap from your outer genital area after a shower, and dry the area well to prevent irritation. Don't use scented or harsh soaps, such as those with deodorant or antibacterial action. Marland Kitchen Avoid irritants. These include scented tampons and pads. . Wipe from front to back after using the toilet. Doing so avoids spreading fecal bacteria to your vagina.  Other things that may help prevent vaginosis include:  Marland Kitchen Don't douche. Your vagina doesn't require cleansing other than normal bathing. Repetitive douching disrupts the normal organisms that reside in the vagina and can actually increase your risk of vaginal infection. Douching won't clear up a vaginal infection. . Use a latex condom. Both female and female latex condoms may help you avoid infections spread by sexual contact. . Wear cotton underwear. Also wear pantyhose with a cotton crotch. If you feel comfortable without it, skip wearing underwear to bed. Yeast thrives in [employer] Your symptoms should improve in the next day or two.  GET HELP RIGHT AWAY IF:  . You have pain in your lower abdomen ( pelvic area or over your ovaries) . You develop nausea or vomiting . You develop a fever . Your discharge changes or worsens . You have persistent pain with intercourse . You develop shortness of breath, a rapid pulse, or you faint.  These symptoms could be signs of problems or infections that need to be evaluated by a medical provider now.  MAKE SURE YOU    Understand these instructions.  Will watch your condition.  Will get help right away if you are not doing well or get worse.  Your e-visit answers were reviewed by a board certified advanced clinical practitioner to complete your personal care plan. Depending upon the condition, your plan could  have included both over the counter or prescription medications. Please review your pharmacy choice to make sure that you have choses a pharmacy that is open for you to pick up any needed prescription, Your safety is important to us. If you have drug allergies check your prescription carefully.   You can use MyChart to ask questions about today's visit, request a non-urgent call back, or ask for a work or school excuse for 24 hours related to this e-Visit. If it has been greater than 24 hours you will need to follow up with your provider, or enter a new e-Visit to address those concerns. You will get a MyChart message within the next two days asking about your experience. I hope that your e-visit has been valuable and will speed your recovery.  

## 2015-05-20 MED FILL — FLUCONAZOLE 150 MG TABLET: 150 | 3 days supply | Qty: 2 | Fill #0

## 2015-06-16 ENCOUNTER — Telehealth: Payer: 59 | Admitting: Family

## 2015-06-16 DIAGNOSIS — N760 Acute vaginitis: Secondary | ICD-10-CM | POA: Diagnosis not present

## 2015-06-16 DIAGNOSIS — A499 Bacterial infection, unspecified: Secondary | ICD-10-CM | POA: Diagnosis not present

## 2015-06-16 DIAGNOSIS — B9689 Other specified bacterial agents as the cause of diseases classified elsewhere: Secondary | ICD-10-CM

## 2015-06-16 MED ORDER — METRONIDAZOLE 500 MG PO TABS: 500.0000 mg | ORAL_TABLET | Freq: Two times a day (BID) | ORAL | Status: DC

## 2015-06-16 MED FILL — metroNIDAZOLE 500 MG TABS: 500 | 7 days supply | Qty: 14 | Fill #0

## 2015-06-16 NOTE — Progress Notes (Signed)

## 2015-09-08 ENCOUNTER — Telehealth: Payer: 59 | Admitting: Family

## 2015-09-08 DIAGNOSIS — A499 Bacterial infection, unspecified: Secondary | ICD-10-CM

## 2015-09-08 DIAGNOSIS — B9689 Other specified bacterial agents as the cause of diseases classified elsewhere: Secondary | ICD-10-CM

## 2015-09-08 DIAGNOSIS — N760 Acute vaginitis: Secondary | ICD-10-CM | POA: Diagnosis not present

## 2015-09-08 MED ORDER — METRONIDAZOLE 500 MG PO TABS: 500.0000 mg | ORAL_TABLET | Freq: Two times a day (BID) | ORAL | Status: DC

## 2015-09-08 MED FILL — metroNIDAZOLE 500 MG TABS: 500 | 7 days supply | Qty: 14 | Fill #0

## 2015-09-08 NOTE — Progress Notes (Signed)

## 2015-10-08 ENCOUNTER — Telehealth: Payer: 59 | Admitting: Nurse Practitioner

## 2015-10-08 DIAGNOSIS — N3000 Acute cystitis without hematuria: Secondary | ICD-10-CM | POA: Diagnosis not present

## 2015-10-08 MED ORDER — NITROFURANTOIN MONOHYD MACRO 100 MG PO CAPS: 100.0000 mg | ORAL_CAPSULE | Freq: Two times a day (BID) | ORAL | Status: DC

## 2015-10-08 MED ORDER — PHENAZOPYRIDINE HCL 100 MG PO TABS: 100.0000 mg | ORAL_TABLET | Freq: Three times a day (TID) | ORAL | Status: DC | PRN

## 2015-10-08 MED FILL — PHENAZOPYRIDINE 100 MG TAB: 100 | 3 days supply | Qty: 10 | Fill #0

## 2015-10-08 MED FILL — NITROFURANTOIN MONO-MCR 100: 100 | 7 days supply | Qty: 14 | Fill #0

## 2015-10-08 NOTE — Progress Notes (Signed)
We are sorry that you are not feeling well.  Here is how we plan to help!  Based on what you shared with me it looks like you most likely have a simple urinary tract infection.  A UTI (Urinary Tract Infection) is a bacterial infection of the bladder.  Most cases of urinary tract infections are simple to treat but a key part of your care is to encourage you to drink plenty of fluids and watch your symptoms carefully.  I have prescribed MacroBid 100 mg twice a day for 5 days.  Your symptoms should gradually improve. Call us if the burning in your urine worsens, you develop worsening fever, back pain or pelvic pain or if your symptoms do not resolve after completing the antibiotic. Also sent in pyridium for bladder spasms.  Urinary tract infections can be prevented by drinking plenty of water to keep your body hydrated.  Also be sure when you wipe, wipe from front to back and don't hold it in!  If possible, empty your bladder every 4 hours.  Your e-visit answers were reviewed by a board certified advanced clinical practitioner to complete your personal care plan.  Depending on the condition, your plan could have included both over the counter or prescription medications.  If there is a problem please reply  once you have received a response from your provider.  Your safety is important to [place].  If you have drug allergies check your prescription carefully.    You can use MyChart to ask questions about today's visit, request a non-urgent call back, or ask for a work or school excuse for 24 hours related to this e-Visit. If it has been greater than 24 hours you will need to follow up with your provider, or enter a new e-Visit to address those concerns.   You will get an e-mail in the next two days asking about your experience.  I hope that your e-visit has been valuable and will speed your recovery. Thank you for using e-visits.

## 2015-11-20 ENCOUNTER — Telehealth: Payer: 59 | Admitting: Physician Assistant

## 2015-11-20 DIAGNOSIS — M549 Dorsalgia, unspecified: Secondary | ICD-10-CM

## 2015-11-20 NOTE — Progress Notes (Signed)
Based on what you shared with me it looks like you have a condition that should be evaluated in a face to face office visit. It sounds like a muscle strain of the upper back. While this is typically treated with muscle relaxers and anti-inflammatories (Naproxen and Aleve), I cannot send medications out of state via e-visits. It is part of our policy and should be noted on the main page when you start e-visits that pharmacy selected must be in [place]. Since you are out of state, you will need to go to an Urgent Care for assessment if pain is not improving with over-the-counter Aleve or Ibuprofen and rest. A heating pad placed on the area for 10 minutes a few times per day may also help.   If you are having a true medical emergency please call 911.  If you need an urgent face to face visit, [place] has four urgent care centers for your convenience.  If you need care fast and have a high deductible or no insurance consider:   [email or web address]  [phone]  [address], [place], [place] [address] 8 am to 8 pm Monday-Friday 10 am to 4 pm Saturday-Sunday   The following sites will take your  insurance:    . [facility] Health Urgent [place] a Provider at this Location  [address] [place], [place] [address] . 10 am to 8 pm Monday-Friday . 12 pm to 8 pm Saturday-Sunday   . [facility] Health Urgent Care at [place] a Provider at this Location  [place], [place], [place] [address] . 8 am to 8 pm Monday-Friday . 9 am to 6 pm Saturday . 11 am to 6 pm Sunday   . [facility] Health Urgent Care at [place] Get Driving Directions  [id number] [place], [place] [address] . 8 am to 8 pm Monday-Friday . 8 am to 4 pm Saturday-Sunday   . [facility] (a walk in primary care provider)  [place] a Provider at this Location  [place], [place] [address] . 8 am to 8:30 pm Monday-Thursday . 8 am to 6 pm Friday . 8 am to 4 pm Saturday-Sunday   Your e-visit answers were reviewed by a board certified advanced clinical practitioner to complete your personal care plan.  Thank you for using e-Visits.

## 2015-12-06 ENCOUNTER — Telehealth: Payer: 59 | Admitting: Family

## 2015-12-06 DIAGNOSIS — N390 Urinary tract infection, site not specified: Secondary | ICD-10-CM

## 2015-12-06 MED ORDER — SULFAMETHOXAZOLE-TRIMETHOPRIM 800-160 MG PO TABS: 1.0000 | ORAL_TABLET | Freq: Two times a day (BID) | ORAL | 0 refills | Status: DC

## 2015-12-06 NOTE — Progress Notes (Signed)

## 2015-12-30 ENCOUNTER — Telehealth: Payer: 59 | Admitting: Physician Assistant

## 2015-12-30 DIAGNOSIS — N760 Acute vaginitis: Secondary | ICD-10-CM | POA: Diagnosis not present

## 2015-12-30 DIAGNOSIS — B9689 Other specified bacterial agents as the cause of diseases classified elsewhere: Secondary | ICD-10-CM

## 2015-12-30 DIAGNOSIS — N898 Other specified noninflammatory disorders of vagina: Secondary | ICD-10-CM | POA: Diagnosis not present

## 2015-12-30 MED ORDER — METRONIDAZOLE 500 MG PO TABS: 500.0000 mg | ORAL_TABLET | Freq: Two times a day (BID) | ORAL | 0 refills | Status: DC

## 2015-12-30 MED FILL — metroNIDAZOLE 500 MG TABS: 500 | 7 days supply | Qty: 14 | Fill #0

## 2015-12-30 NOTE — Progress Notes (Signed)
Based on what you shared with me it looks like you have a serious condition that should be evaluated in a face to face office visit. Your symptoms could be consistent with a bacterial vaginosis. However the mention of pain with intercourse warrants the needs for a vaginal examination and testing to rule out other causes of symptoms such as trichomoniasis or pelvic inflammatory disease which can be serious if left untreated.  If you are having a true medical emergency please call 911.  If you need an urgent face to face visit,  has four urgent care centers for your convenience.  If you need care fast and have a high deductible or no insurance consider:   [email or web address]  [phone]  [address] [place], [place] [address] 8 am to 8 pm Monday-Friday 10 am to 4 pm Saturday-Sunday   The following sites will take your  insurance:    . [facility] Health Urgent [place] a Provider at this Location  [address] [place], [place] [address] . 10 am to 8 pm Monday-Friday . 12 pm to 8 pm Saturday-Sunday   . [facility] Health Urgent Care at [place] a Provider at this Location  [place], [place], [place] [address] . 8 am to 8 pm Monday-Friday . 9 am to 6 pm Saturday . 11 am to 6 pm Sunday   . [facility] Health Urgent Care at [place] Get Driving Directions  [id number] [place], [place] [address] . 8 am to 8 pm Monday-Friday . 8 am to 4 pm Saturday-Sunday   . [facility] (walk-ins welcome, or call for a scheduled time)  [phone]  Get Driving Directions Find a Provider at this Location  [place], [place] [address] . 8 am to 8:30 pm Monday-Thursday . 8 am to 6 pm Friday . 8 am to 4 pm Saturday-Sunday   Your e-visit answers were reviewed by a board certified  advanced clinical practitioner to complete your personal care plan.  Thank you for using e-Visits.

## 2015-12-30 NOTE — Progress Notes (Signed)

## 2015-12-30 NOTE — Addendum Note (Signed)
Addended by: Raiford Noble on: 12/30/2015 05:15 PM   Modules accepted: Orders

## 2016-02-15 ENCOUNTER — Telehealth: Payer: 59 | Admitting: Family

## 2016-02-15 DIAGNOSIS — N390 Urinary tract infection, site not specified: Secondary | ICD-10-CM | POA: Diagnosis not present

## 2016-02-15 DIAGNOSIS — A499 Bacterial infection, unspecified: Secondary | ICD-10-CM

## 2016-02-15 MED ORDER — SULFAMETHOXAZOLE-TRIMETHOPRIM 800-160 MG PO TABS: 1.0000 | ORAL_TABLET | Freq: Two times a day (BID) | ORAL | 0 refills | Status: DC

## 2016-02-15 MED FILL — SULFAMETHOXAZOLE-TMP DS TAB: 800-160 | 5 days supply | Qty: 10 | Fill #0

## 2016-02-15 NOTE — Progress Notes (Signed)

## 2016-03-13 ENCOUNTER — Telehealth: Payer: 59 | Admitting: Family

## 2016-03-13 DIAGNOSIS — N390 Urinary tract infection, site not specified: Secondary | ICD-10-CM | POA: Diagnosis not present

## 2016-03-13 MED ORDER — NITROFURANTOIN MONOHYD MACRO 100 MG PO CAPS: 100.0000 mg | ORAL_CAPSULE | Freq: Two times a day (BID) | ORAL | 0 refills | Status: DC

## 2016-03-13 MED FILL — NITROFURANTOIN MONO-MCR 100: 100 | 5 days supply | Qty: 10 | Fill #0

## 2016-03-13 NOTE — Progress Notes (Signed)

## 2016-07-13 ENCOUNTER — Telehealth: Payer: 59 | Admitting: Physician Assistant

## 2016-07-13 DIAGNOSIS — R399 Unspecified symptoms and signs involving the genitourinary system: Secondary | ICD-10-CM

## 2016-07-13 MED ORDER — CEPHALEXIN 500 MG PO CAPS: 500.0000 mg | ORAL_CAPSULE | Freq: Two times a day (BID) | ORAL | 0 refills | Status: AC

## 2016-07-13 MED FILL — CEPHALEXIN 500 MG CAPSULE: 500 | 7 days supply | Qty: 14 | Fill #0

## 2016-07-13 NOTE — Progress Notes (Signed)

## 2016-08-11 ENCOUNTER — Telehealth: Payer: 59 | Admitting: Family

## 2016-08-11 DIAGNOSIS — N760 Acute vaginitis: Secondary | ICD-10-CM

## 2016-08-11 DIAGNOSIS — B9689 Other specified bacterial agents as the cause of diseases classified elsewhere: Secondary | ICD-10-CM

## 2016-08-11 MED ORDER — METRONIDAZOLE 500 MG PO TABS: 500.0000 mg | ORAL_TABLET | Freq: Two times a day (BID) | ORAL | 0 refills | Status: AC

## 2016-08-11 MED FILL — metroNIDAZOLE 500 MG TABS: 500 | 7 days supply | Qty: 14 | Fill #0

## 2016-08-11 NOTE — Progress Notes (Signed)

## 2016-09-14 ENCOUNTER — Telehealth: Payer: 59 | Admitting: Physician Assistant

## 2016-09-14 DIAGNOSIS — B373 Candidiasis of vulva and vagina: Secondary | ICD-10-CM | POA: Diagnosis not present

## 2016-09-14 DIAGNOSIS — B3731 Acute candidiasis of vulva and vagina: Secondary | ICD-10-CM

## 2016-09-14 MED ORDER — FLUCONAZOLE 150 MG PO TABS: 150.0000 mg | ORAL_TABLET | Freq: Once | ORAL | 0 refills | Status: AC

## 2016-09-14 MED FILL — FLUCONAZOLE 150 MG TABLET: 150 | 1 days supply | Qty: 1 | Fill #0

## 2016-09-14 NOTE — Progress Notes (Signed)

## 2016-09-19 ENCOUNTER — Other Ambulatory Visit: Payer: Self-pay | Admitting: Physician Assistant

## 2016-09-19 MED ORDER — FLUCONAZOLE 150 MG PO TABS: 150.0000 mg | ORAL_TABLET | Freq: Once | ORAL | 0 refills | Status: AC

## 2016-10-06 ENCOUNTER — Telehealth: Payer: 59 | Admitting: Family

## 2016-10-06 DIAGNOSIS — N390 Urinary tract infection, site not specified: Secondary | ICD-10-CM | POA: Diagnosis not present

## 2016-10-06 MED ORDER — NITROFURANTOIN MONOHYD MACRO 100 MG PO CAPS: 100.0000 mg | ORAL_CAPSULE | Freq: Two times a day (BID) | ORAL | 0 refills | Status: DC

## 2016-10-06 MED FILL — NITROFURANTOIN MONO-MCR 100: 100 | 5 days supply | Qty: 10 | Fill #0

## 2016-10-06 NOTE — Progress Notes (Signed)
Thank you for the details you put in the comment boxes. Those details really help us take better care of you.   We are sorry that you are not feeling well.  Here is how we plan to help!  Based on what you shared with me it looks like you most likely have a simple urinary tract infection.  A UTI (Urinary Tract Infection) is a bacterial infection of the bladder.  Most cases of urinary tract infections are simple to treat but a key part of your care is to encourage you to drink plenty of fluids and watch your symptoms carefully.  I have prescribed MacroBid 100 mg twice a day for 5 days.  Your symptoms should gradually improve. Call us if the burning in your urine worsens, you develop worsening fever, back pain or pelvic pain or if your symptoms do not resolve after completing the antibiotic.  Urinary tract infections can be prevented by drinking plenty of water to keep your body hydrated.  Also be sure when you wipe, wipe from front to back and don't hold it in!  If possible, empty your bladder every 4 hours.  Your e-visit answers were reviewed by a board certified advanced clinical practitioner to complete your personal care plan.  Depending on the condition, your plan could have included both over the counter or prescription medications.  If there is a problem please reply  once you have received a response from your provider.  Your safety is important to us.  If you have drug allergies check your prescription carefully.    You can use MyChart to ask questions about today's visit, request a non-urgent call back, or ask for a work or school excuse for 24 hours related to this e-Visit. If it has been greater than 24 hours you will need to follow up with your provider, or enter a new e-Visit to address those concerns.   You will get an e-mail in the next two days asking about your experience.  I hope that your e-visit has been valuable and will speed your recovery. Thank you for using  e-visits.    

## 2016-10-31 ENCOUNTER — Telehealth: Payer: 59 | Admitting: Family

## 2016-10-31 DIAGNOSIS — N760 Acute vaginitis: Secondary | ICD-10-CM

## 2016-10-31 DIAGNOSIS — B9689 Other specified bacterial agents as the cause of diseases classified elsewhere: Secondary | ICD-10-CM

## 2016-10-31 MED ORDER — METRONIDAZOLE 500 MG PO TABS: 500.0000 mg | ORAL_TABLET | Freq: Two times a day (BID) | ORAL | 0 refills | Status: DC

## 2016-10-31 MED FILL — metroNIDAZOLE 500 MG TABS: 500 | 7 days supply | Qty: 14 | Fill #0

## 2016-10-31 NOTE — Progress Notes (Signed)

## 2016-11-09 ENCOUNTER — Telehealth: Payer: 59 | Admitting: Physician Assistant

## 2016-11-09 DIAGNOSIS — T22219A Burn of second degree of unspecified forearm, initial encounter: Secondary | ICD-10-CM | POA: Diagnosis not present

## 2016-11-09 MED ORDER — SILVER SULFADIAZINE 1 % EX CREA: 1.0000 "application " | TOPICAL_CREAM | Freq: Two times a day (BID) | CUTANEOUS | 0 refills | Status: DC

## 2016-11-09 MED FILL — SILVADENE 1% CREAM: 1 | 10 days supply | Qty: 50 | Fill #0
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
## 2017-01-03 ENCOUNTER — Telehealth: Payer: 59 | Admitting: Family

## 2017-01-03 DIAGNOSIS — N898 Other specified noninflammatory disorders of vagina: Secondary | ICD-10-CM

## 2017-01-03 NOTE — Progress Notes (Signed)
Based on what you shared with me it looks like you have a serious condition that should be evaluated in a face to face office visit.  NOTE: Even if you have entered your credit card information for this eVisit, you will not be charged.   You need to follow up with your primary care provider. You have been treated for vaginal symptoms 8 times over the last year.   If you are having a true medical emergency please call 911.  If you need an urgent face to face visit, [place] has four urgent care centers for your convenience.  If you need care fast and have a high deductible or no insurance consider:   [email or web address]  [phone]  [address] [place], [place] [address] 8 am to 8 pm Monday-Friday 10 am to 4 pm Saturday-Sunday   The following sites will take your  insurance:    . [facility] Urgent [place] a Provider at this Location  [address] [place], [place] [address] . 10 am to 8 pm Monday-Friday . 12 pm to 8 pm Saturday-Sunday   . [facility] Urgent Care at [place] a Provider at this Location  [place], [place], [place] [address] . 8 am to 8 pm Monday-Friday . 9 am to 6 pm Saturday . 11 am to 6 pm Sunday   . [facility] Health Urgent Care at [place] Get Driving Directions  [address] [place], [place] [address] . 8 am to 8 pm Monday-Friday . 8 am to 4 pm Saturday-Sunday   Your e-visit answers were reviewed by a board certified advanced clinical practitioner to complete your personal care plan.  Thank you for using e-Visits.

## 2017-01-30 ENCOUNTER — Other Ambulatory Visit: Payer: Self-pay

## 2017-01-30 ENCOUNTER — Encounter: Payer: Self-pay | Admitting: *Deleted

## 2017-01-30 ENCOUNTER — Emergency Department (INDEPENDENT_AMBULATORY_CARE_PROVIDER_SITE_OTHER)
Admission: EM | Admit: 2017-01-30 | Discharge: 2017-01-30 | Disposition: A | Discharge status: Home / Self Care | Payer: 59 | Source: Home / Self Care | Attending: Family Medicine | Admitting: Family Medicine

## 2017-01-30 DIAGNOSIS — R350 Frequency of micturition: Secondary | ICD-10-CM

## 2017-01-30 DIAGNOSIS — N760 Acute vaginitis: Secondary | ICD-10-CM

## 2017-01-30 DIAGNOSIS — B9689 Other specified bacterial agents as the cause of diseases classified elsewhere: Secondary | ICD-10-CM

## 2017-01-30 LAB — POCT URINALYSIS DIP (MANUAL ENTRY)
BILIRUBIN UA: NEGATIVE
BILIRUBIN UA: NEGATIVE mg/dL
Glucose, UA: NEGATIVE mg/dL
LEUKOCYTES UA: NEGATIVE
NITRITE UA: NEGATIVE
PROTEIN UA: NEGATIVE mg/dL
Spec Grav, UA: 1.025 (ref 1.010–1.025)
Urobilinogen, UA: 0.2 E.U./dL
pH, UA: 5.5 (ref 5.0–8.0)

## 2017-01-30 MED ORDER — METRONIDAZOLE 0.75 % VA GEL: VAGINAL | 0 refills | Status: DC

## 2017-01-30 MED ORDER — FLUCONAZOLE 150 MG PO TABS: 150.0000 mg | ORAL_TABLET | Freq: Once | ORAL | 0 refills | Status: AC

## 2017-01-30 NOTE — ED Triage Notes (Signed)
Pt c/o vaginal odor and discomfort with painful intercourse intermittent x 1- 2 months. Hx of BV. She also c/o frequent urination and bladder spasms x 3 days.

## 2017-01-30 NOTE — ED Provider Notes (Signed)
[name]    CSN: [id number] Arrival date & time: 01/30/17  1814     History   Chief Complaint Chief Complaint  Patient presents with  . Urinary Frequency    HPI Megan Rios is a 41 y.o. female.   Patient has a history of recurring BV, and complains of malodorous vaginal discharge and dyspareunia for about two months.  No abdominal or pelvic pain.  No rash or lesions.  She complains of 3 day history of urinary frequency and urgency without dysuria.  She has no concern for possible STD.  Her sexual partner recently had negative STD testing.  Patient's last menstrual period was 01/24/2017.  She reports that she took a left-over Septra tab once daily during the past two days without much improvement.    Vaginal Discharge  Quality:  Malodorous and thin Severity:  Mild Onset quality:  Gradual Duration:  2 months Timing:  Intermittent Progression:  Worsening Chronicity:  Recurrent Context: after intercourse and at rest   Relieved by:  Nothing Worsened by:  Nothing Ineffective treatments:  Prescription medications Associated symptoms: dyspareunia and urinary frequency   Associated symptoms: no abdominal pain, no dysuria, no fever, no genital lesions, no nausea, no rash, no urinary hesitancy, no urinary incontinence, no vaginal itching and no vomiting   Risk factors: no new sexual partner and no STI exposure     Past Medical History:  Diagnosis Date  . Anxiety   . Palpitations     Patient Active Problem List   Diagnosis Date Noted  . Palpitations 01/02/2011    Past Surgical History:  Procedure Laterality Date  . THYROID CYST EXCISION  1981    OB History    Gravida Para Term Preterm AB Living   0 0 0 0 0 0   SAB TAB Ectopic Multiple Live Births   0 0 0 0         Home Medications    Prior to Admission medications   Medication Sig Start Date End Date Taking? Authorizing Provider  cetirizine (ZYRTEC) 10 MG tablet Take 10 mg daily by mouth.    Yes [provider]  fluconazole (DIFLUCAN) 150 MG tablet Take 1 tablet (150 mg total) once for 1 dose by mouth. 01/30/17 01/30/17  Kandra Nicolas, MD  fluticasone Asencion Islam) 50 MCG/ACT nasal spray 1 or 2 sprays each nostril twice a day 12/15/13   Jacqulyn Cane, MD  Homeopathic Products [facility]) SUPP Place 1 suppository vaginally at bedtime. 01/14/15   Regina Eck, CNM  ibuprofen (ADVIL,MOTRIN) 800 MG tablet Take 800 mg by mouth every 8 (eight) hours as needed for pain.     [provider]  metroNIDAZOLE (METROGEL) 0.75 % vaginal gel Place one applicatorful in vagina once daily for five days 01/30/17   Kandra Nicolas, MD  Multiple Vitamins-Minerals (MULTIVITAMIN WITH MINERALS) tablet Take 1 tablet by mouth daily.    [provider]  Norethin-Eth Estrad-Fe Biphas (LO LOESTRIN FE PO) Take 1 mg by mouth.    [provider]  Probiotic Product (PROBIOTIC PO) Take 1 tablet by mouth daily as needed.    [provider]  vitamin B-12 (CYANOCOBALAMIN) 1000 MCG tablet Take 1,000 mcg by mouth daily.    [provider]    Family History Family History  Problem Relation Age of Onset  . Stroke Maternal Grandmother   . Diabetes Maternal Grandmother   . Hypertension Maternal Grandmother   . Diabetes Father   . Hypertension  Father   . Stroke Paternal Grandmother   . Hypertension Paternal Grandmother   . Diabetes Paternal Grandfather   . Heart attack Paternal Grandfather   . Hypertension Paternal Grandfather     Social History Social History   Tobacco Use  . Smoking status: Never Smoker  . Smokeless tobacco: Never Used  Substance Use Topics  . Alcohol use: Yes    Alcohol/week: 0.5 oz    Types: 1 Standard drinks or equivalent per week  . Drug use: No     Allergies   Patient has no known allergies.   Review of Systems Review of Systems  Constitutional: Negative for fever.  Gastrointestinal: Negative for abdominal pain, nausea  and vomiting.  Genitourinary: Positive for dyspareunia and vaginal discharge. Negative for bladder incontinence, dysuria and hesitancy.  All other systems reviewed and are negative.    Physical Exam Triage Vital Signs ED Triage Vitals  Enc Vitals Group     BP 01/30/17 1854 (!) 142/90     Pulse Rate 01/30/17 1854 90     Resp 01/30/17 1854 16     Temp 01/30/17 1854 98.5 F (36.9 C)     Temp Source 01/30/17 1854 Oral     SpO2 01/30/17 1854 100 %     Weight 01/30/17 1855 164 lb (74.4 kg)     Height 01/30/17 1855 5' 8.75" (1.746 m)     Head Circumference --      Peak Flow --      Pain Score 01/30/17 1855 0     Pain Loc --      Pain Edu? --      Excl. in [place]? --    No data found.  Updated Vital Signs BP (!) 142/90 (BP Location: Right Arm)   Pulse 90   Temp 98.5 F (36.9 C) (Oral)   Resp 16   Ht 5' 8.75" (1.746 m)   Wt 164 lb (74.4 kg)   LMP 01/24/2017   SpO2 100%   BMI 24.40 kg/m   Visual Acuity Right Eye Distance:   Left Eye Distance:   Bilateral Distance:    Right Eye Near:   Left Eye Near:    Bilateral Near:     Physical Exam Nursing notes and Vital Signs reviewed. Appearance:  Patient appears stated age, and in no acute distress.    Eyes:  Pupils are equal, round, and reactive to light and accomodation.  Extraocular movement is intact.  Conjunctivae are not inflamed   Pharynx:  Normal; moist mucous membranes  Neck:  Supple.  No adenopathy Lungs:  Clear to auscultation.  Breath sounds are equal.  Moving air well. Heart:  Regular rate and rhythm without murmurs, rubs, or gallops.  Abdomen:  Nontender without masses or hepatosplenomegaly.  Bowel sounds are present.  No CVA or flank tenderness.  Extremities:  No edema.  Skin:  No rash present.    Pelvic exam deferred.  UC Treatments / Results  Labs (all labs ordered are listed, but only abnormal results are displayed) Labs Reviewed  POCT URINALYSIS DIP (MANUAL ENTRY) - Abnormal; Notable for the following  components:      Result Value   Blood, UA trace-intact (*)    All other components within normal limits  URINE CULTURE  POCT WET + KOH PREP:  No WBC; epithelial cells present; Clue cells present; no trich; few bacteria; hyphae seen    EKG  EKG Interpretation None       Radiology No results found.  Procedures Procedures (including critical care time)  Medications Ordered in UC Medications - No data to display   Initial Impression / Assessment and Plan / UC Course  I have reviewed the triage vital signs and the nursing notes.  Pertinent labs & imaging results that were available during my care of the patient were reviewed by me and considered in my medical decision making (see chart for details).    Note unremarkable urinalysis today. Urine culture pending (add antibiotic if positive) Begin Metrogel Vaginal for 5 days.  Diflucan one dose. Increase fluid intake. Followup with Family Doctor if not improved in one week.     Final Clinical Impressions(s) / UC Diagnoses   Final diagnoses:  Urinary frequency  Bacterial vaginosis    ED Discharge Orders        Ordered    metroNIDAZOLE (METROGEL) 0.75 % vaginal gel     01/30/17 1957    fluconazole (DIFLUCAN) 150 MG tablet   Once     01/30/17 1957           Kandra Nicolas, MD 02/02/17 1015

## 2017-01-30 NOTE — Discharge Instructions (Signed)
Continue increased fluid intake. 

## 2017-02-01 ENCOUNTER — Telehealth: Payer: Self-pay

## 2017-02-01 LAB — URINE CULTURE
MICRO NUMBER: 81247321
SPECIMEN QUALITY: ADEQUATE

## 2017-02-01 NOTE — Telephone Encounter (Signed)
Left VM to call office for results.  Contact information given.

## 2017-02-02 ENCOUNTER — Telehealth: Payer: 59 | Admitting: Nurse Practitioner

## 2017-02-02 ENCOUNTER — Telehealth: Payer: Self-pay

## 2017-02-02 DIAGNOSIS — N3000 Acute cystitis without hematuria: Secondary | ICD-10-CM

## 2017-02-02 LAB — POCT WET + KOH PREP: TRICH BY WET PREP: ABSENT

## 2017-02-02 MED ORDER — CEPHALEXIN 500 MG PO CAPS: 500.0000 mg | ORAL_CAPSULE | Freq: Four times a day (QID) | ORAL | 0 refills | Status: DC

## 2017-02-02 MED FILL — CEPHALEXIN 500 MG CAPSULE: 500 | 3 days supply | Qty: 14 | Fill #0

## 2017-02-02 NOTE — Telephone Encounter (Signed)
Pt called back, and results given.  Pt still having bladder spasms, but id drinking fluids and will continue medication.  Will follow up as needed.

## 2017-02-02 NOTE — Progress Notes (Signed)

## 2017-04-30 DIAGNOSIS — L700 Acne vulgaris: Secondary | ICD-10-CM | POA: Diagnosis not present

## 2017-07-09 DIAGNOSIS — M20011 Mallet finger of right finger(s): Secondary | ICD-10-CM | POA: Diagnosis not present

## 2017-07-09 DIAGNOSIS — M25551 Pain in right hip: Secondary | ICD-10-CM | POA: Diagnosis not present

## 2017-07-09 MED FILL — MELOXICAM 15 MG TABLET: 15 | 30 days supply | Qty: 30 | Fill #0

## 2017-08-24 ENCOUNTER — Telehealth: Payer: 59 | Admitting: Nurse Practitioner

## 2017-08-24 DIAGNOSIS — N3000 Acute cystitis without hematuria: Secondary | ICD-10-CM | POA: Diagnosis not present

## 2017-08-24 MED ORDER — CIPROFLOXACIN HCL 500 MG PO TABS: 500.0000 mg | ORAL_TABLET | Freq: Two times a day (BID) | ORAL | 0 refills | Status: DC

## 2017-08-24 NOTE — Progress Notes (Signed)

## 2017-08-24 NOTE — Progress Notes (Signed)
Actually sent in cipro not macrobid. Meds ordered this encounter  Medications  . ciprofloxacin (CIPRO) 500 MG tablet    Sig: Take 1 tablet (500 mg total) by mouth 2 (two) times daily.    Dispense:  10 tablet    Refill:  0    Order Specific Question:   Supervising Provider    Answer:   Ricard Dillon [phone]

## 2017-08-29 MED ORDER — NITROFURANTOIN MONOHYD MACRO 100 MG PO CAPS: 100.0000 mg | ORAL_CAPSULE | Freq: Two times a day (BID) | ORAL | 0 refills | Status: DC

## 2017-08-29 NOTE — Addendum Note (Signed)
Addended by: Benjamine Mola on: 08/29/2017 06:11 PM   Modules accepted: Orders

## 2017-09-17 DIAGNOSIS — Z833 Family history of diabetes mellitus: Secondary | ICD-10-CM | POA: Diagnosis not present

## 2017-09-17 DIAGNOSIS — Z01419 Encounter for gynecological examination (general) (routine) without abnormal findings: Secondary | ICD-10-CM | POA: Diagnosis not present

## 2017-09-17 DIAGNOSIS — Z1231 Encounter for screening mammogram for malignant neoplasm of breast: Secondary | ICD-10-CM | POA: Diagnosis not present

## 2017-09-17 DIAGNOSIS — Z13220 Encounter for screening for lipoid disorders: Secondary | ICD-10-CM | POA: Diagnosis not present

## 2017-09-17 DIAGNOSIS — Z130 Encounter for screening for diseases of the blood and blood-forming organs and certain disorders involving the immune mechanism: Secondary | ICD-10-CM | POA: Diagnosis not present

## 2017-09-17 DIAGNOSIS — Z0000 Encounter for general adult medical examination without abnormal findings: Secondary | ICD-10-CM | POA: Diagnosis not present

## 2017-09-17 DIAGNOSIS — Z1329 Encounter for screening for other suspected endocrine disorder: Secondary | ICD-10-CM | POA: Diagnosis not present

## 2017-09-17 DIAGNOSIS — R350 Frequency of micturition: Secondary | ICD-10-CM | POA: Diagnosis not present

## 2017-09-17 DIAGNOSIS — Z6822 Body mass index (BMI) 22.0-22.9, adult: Secondary | ICD-10-CM | POA: Diagnosis not present

## 2017-09-17 DIAGNOSIS — N760 Acute vaginitis: Secondary | ICD-10-CM | POA: Diagnosis not present

## 2017-09-17 DIAGNOSIS — Z1151 Encounter for screening for human papillomavirus (HPV): Secondary | ICD-10-CM | POA: Diagnosis not present

## 2017-09-17 MED FILL — CLINDAMYCIN HCL 300 MG CAPS: 300 | 7 days supply | Qty: 14 | Fill #0

## 2017-09-19 DIAGNOSIS — E875 Hyperkalemia: Secondary | ICD-10-CM | POA: Diagnosis not present

## 2017-10-01 DIAGNOSIS — N944 Primary dysmenorrhea: Secondary | ICD-10-CM | POA: Diagnosis not present

## 2017-10-01 DIAGNOSIS — N83292 Other ovarian cyst, left side: Secondary | ICD-10-CM | POA: Diagnosis not present

## 2017-10-01 DIAGNOSIS — N83291 Other ovarian cyst, right side: Secondary | ICD-10-CM | POA: Diagnosis not present

## 2017-12-07 DIAGNOSIS — R350 Frequency of micturition: Secondary | ICD-10-CM | POA: Diagnosis not present

## 2017-12-07 MED FILL — NITROFURANTOIN MONO-MCR 100: 100 | 15 days supply | Qty: 30 | Fill #0

## 2017-12-10 DIAGNOSIS — N83202 Unspecified ovarian cyst, left side: Secondary | ICD-10-CM | POA: Diagnosis not present

## 2017-12-10 DIAGNOSIS — D259 Leiomyoma of uterus, unspecified: Secondary | ICD-10-CM | POA: Diagnosis not present

## 2017-12-10 DIAGNOSIS — N949 Unspecified condition associated with female genital organs and menstrual cycle: Secondary | ICD-10-CM | POA: Diagnosis not present

## 2017-12-10 DIAGNOSIS — N83201 Unspecified ovarian cyst, right side: Secondary | ICD-10-CM | POA: Diagnosis not present

## 2017-12-10 MED FILL — LEVONOR-ETH ESTRAD 0.15-0.0: 0.15-30 | 21 days supply | Qty: 28 | Fill #0

## 2017-12-17 DIAGNOSIS — H527 Unspecified disorder of refraction: Secondary | ICD-10-CM | POA: Diagnosis not present

## 2018-01-04 MED FILL — LEVONOR-ETH ESTRAD 0.15-0.0: 0.15-30 | 84 days supply | Qty: 112 | Fill #1

## 2018-03-03 ENCOUNTER — Telehealth: Payer: 59 | Admitting: Family

## 2018-03-03 DIAGNOSIS — J00 Acute nasopharyngitis [common cold]: Secondary | ICD-10-CM | POA: Diagnosis not present

## 2018-03-03 MED ORDER — BENZONATATE 100 MG PO CAPS: 100.0000 mg | ORAL_CAPSULE | Freq: Three times a day (TID) | ORAL | 0 refills | Status: DC | PRN

## 2018-03-03 MED ORDER — FLUTICASONE PROPIONATE 50 MCG/ACT NA SUSP: 1.0000 | Freq: Two times a day (BID) | NASAL | 6 refills | Status: AC

## 2018-03-03 NOTE — Progress Notes (Signed)
Thank you for the details you included in the comment boxes. Those details are very helpful in determining the best course of treatment for you and help [place] to provide the best care.  We are sorry you are not feeling well.  Here is how we plan to help!  Based on what you have shared with me, it looks like you may have a viral upper respiratory infection or a "common cold".  Colds are caused by a large number of viruses; however, rhinovirus is the most common cause.   Symptoms of the common cold vary from person to person, with common symptoms including sore throat, cough, and malaise.  A low-grade fever of 100.4 may present, but is often uncommon.  Symptoms vary however, and are closely related to a person's age or underlying illnesses.  The most common symptoms associated with the common cold are nasal discharge or congestion, cough, sneezing, headache and pressure in the ears and face.  Cold symptoms usually persist for about 3 to 10 days, but can last up to 2 weeks.  It is important to know that colds do not cause serious illness or complications in most cases.    The common cold is transmitted from person to person, with the most common method of transmission being a person's hands.  The virus is able to live on the skin and can infect other persons for up to 2 hours after direct contact.  Also, colds are transmitted when someone coughs or sneezes; thus, it is important to cover the mouth to reduce this risk.  To keep the spread of the common cold at [place], good hand hygiene is very important.  This is an infection that is most likely caused by a virus. There are no specific treatments for the common cold other than to help you with the symptoms until the infection runs its course.    For nasal congestion, you may use an oral decongestants such as Mucinex D or if you have glaucoma or high blood pressure use plain Mucinex.  Saline nasal spray or nasal drops can help and can safely be used as often as  needed for congestion.  For your congestion, I have prescribed Fluticasone nasal spray one spray in each nostril twice a day  If you do not have a history of heart disease, hypertension, diabetes or thyroid disease, prostate/bladder issues or glaucoma, you may also use Sudafed to treat nasal congestion.  It is highly recommended that you consult with a pharmacist or your primary care physician to ensure this medication is safe for you to take.     If you have a cough, you may use cough suppressants such as Delsym and Robitussin.  If you have glaucoma or high blood pressure, you can also use Coricidin HBP.   For cough I have prescribed for you A prescription cough medication called Tessalon Perles 100 mg. You may take 1-2 capsules every 8 hours as needed for cough  If you have a sore or scratchy throat, use a saltwater gargle-  to  teaspoon of salt dissolved in a 4-ounce to 8-ounce glass of warm water.  Gargle the solution for approximately 15-30 seconds and then spit.  It is important not to swallow the solution.  You can also use throat lozenges/cough drops and Chloraseptic spray to help with throat pain or discomfort.  Warm or cold liquids can also be helpful in relieving throat pain.  For headache, pain or general discomfort, you can use Ibuprofen or Tylenol  as directed.   Some authorities believe that zinc sprays or the use of Echinacea may shorten the course of your symptoms.   HOME CARE . Only take medications as instructed by your medical team. . Be sure to drink plenty of fluids. Water is fine as well as fruit juices, sodas and electrolyte beverages. You may want to stay away from caffeine or alcohol. If you are nauseated, try taking small sips of liquids. How do you know if you are getting enough fluid? Your urine should be a pale yellow or almost colorless. . Get rest. . Taking a steamy shower or using a humidifier may help nasal congestion and ease sore throat pain. You can place a  towel over your head and breathe in the steam from hot water coming from a faucet. . Using a saline nasal spray works much the same way. . Cough drops, hard candies and sore throat lozenges may ease your cough. . Avoid close contacts especially the very young and the elderly . Cover your mouth if you cough or sneeze . Always remember to wash your hands.   GET HELP RIGHT AWAY IF: . You develop worsening fever. . If your symptoms do not improve within 10 days . You develop yellow or green discharge from your nose over 3 days. . You have coughing fits . You develop a severe head ache or visual changes. . You develop shortness of breath, difficulty breathing or start having chest pain . Your symptoms persist after you have completed your treatment plan  MAKE SURE YOU   Understand these instructions.  Will watch your condition.  Will get help right away if you are not doing well or get worse.  Your e-visit answers were reviewed by a board certified advanced clinical practitioner to complete your personal care plan. Depending upon the condition, your plan could have included both over the counter or prescription medications. Please review your pharmacy choice. If there is a problem, you may call our nursing hot line at and have the prescription routed to another pharmacy. Your safety is important to [place]. If you have drug allergies check your prescription carefully.   You can use MyChart to ask questions about today's visit, request a non-urgent call back, or ask for a work or school excuse for 24 hours related to this e-Visit. If it has been greater than 24 hours you will need to follow up with your provider, or enter a new e-Visit to address those concerns. You will get an e-mail in the next two days asking about your experience.  I hope that your e-visit has been valuable and will speed your recovery. Thank you for using e-visits.

## 2018-03-26 ENCOUNTER — Telehealth: Payer: 59 | Admitting: Physician Assistant

## 2018-03-26 DIAGNOSIS — R300 Dysuria: Secondary | ICD-10-CM

## 2018-03-26 MED ORDER — NITROFURANTOIN MONOHYD MACRO 100 MG PO CAPS: 100.0000 mg | ORAL_CAPSULE | Freq: Two times a day (BID) | ORAL | 0 refills | Status: AC

## 2018-03-26 MED FILL — NITROFURANTOIN MONO-MCR 100: 100 | 5 days supply | Qty: 10 | Fill #0

## 2018-03-26 MED FILL — LEVONOR-ETH ESTRAD 0.15-0.0: 0.15-30 | 56 days supply | Qty: 56 | Fill #2

## 2018-03-26 NOTE — Progress Notes (Signed)

## 2018-03-30 ENCOUNTER — Telehealth: Payer: 59 | Admitting: Family

## 2018-03-30 DIAGNOSIS — N390 Urinary tract infection, site not specified: Secondary | ICD-10-CM

## 2018-03-30 NOTE — Progress Notes (Signed)
Based on what you shared with me it looks like you have a serious condition that should be evaluated in a face to face office visit.  NOTE: If you entered your credit card information for this eVisit, you will not be charged. You may see a "hold" on your card for the $30 but that hold will drop off and you will not have a charge processed.  If you are having a true medical emergency please call 911.  If you need an urgent face to face visit, [place] has four urgent care centers for your convenience.  If you need care fast and have a high deductible or no insurance consider:   [email or web address] to reserve your spot online an avoid wait times  [facility] [address], [address] [place], [place] [address] 8 am to 8 pm Monday-Friday 10 am to 4 pm Saturday-Sunday *Across the street from [employer]  [facility]  [address] [place], [address] 8 am to 5 pm Monday-Friday * In the [facility] the [facility]   The following sites will take your  insurance:  . [facility]  [phone] Get Driving Directions Find a Provider at this Location  [address] [place], [place] [address] . 10 am to 8 pm Monday-Friday . 12 pm to 8 pm Saturday-Sunday   . [facility]  [phone] Get Driving Directions Find a Provider at this Location  [address], [address] [place], [place] [address] . 8 am to 8 pm Monday-Friday . 9 am to 6 pm Saturday . 11 am to 6 pm Sunday   .  [facility]gent [facility] at [facility] [place]  [phone] Get Driving Directions  [address] [place], [place] [address] . 8 am to 8 pm Monday-Friday . 8 am to 4 pm Saturday-Sunday   Your e-visit answers were reviewed by a board certified advanced clinical practitioner to complete your personal care plan.  Thank you for using e-Visits.  

## 2018-04-12 ENCOUNTER — Other Ambulatory Visit: Payer: Self-pay

## 2018-04-12 ENCOUNTER — Emergency Department (INDEPENDENT_AMBULATORY_CARE_PROVIDER_SITE_OTHER)
Admission: EM | Admit: 2018-04-12 | Discharge: 2018-04-12 | Disposition: A | Discharge status: Home / Self Care | Payer: 59 | Source: Home / Self Care

## 2018-04-12 DIAGNOSIS — N3000 Acute cystitis without hematuria: Secondary | ICD-10-CM

## 2018-04-12 DIAGNOSIS — R300 Dysuria: Secondary | ICD-10-CM | POA: Diagnosis not present

## 2018-04-12 MED ORDER — FLUCONAZOLE 150 MG PO TABS
150.0000 mg | ORAL_TABLET | Freq: Once | ORAL | 0 refills | Status: AC
Start: 2018-04-12 — End: 2018-04-12

## 2018-04-12 MED ORDER — CIPROFLOXACIN HCL 500 MG PO TABS: 500.0000 mg | ORAL_TABLET | Freq: Two times a day (BID) | ORAL | 0 refills | Status: DC

## 2018-04-12 NOTE — ED Triage Notes (Signed)
Pt had a UTI a couple of weeks ago, was on macrobid.  Yesterday started with frequency, and dysuria.

## 2018-04-12 NOTE — ED Provider Notes (Signed)
[facility]    CSN: [id number] Arrival date & time: 04/12/18  1739     History   Chief Complaint Chief Complaint  Patient presents with  . Dysuria    HPI Megan Rios is a 43 y.o. female.   HPI  UTI Treated for UTI through an E-visit on 03/30/2018 with Macrobid. Symptoms of dysuria, frequency, bladder spasms, slightly improved after antibiotic therapy but never completely resolved.  On yesterday the symptoms of bladder spasms and urinary frequency developed more intensely.  She treated herself with Azo and presents here today for treatment of a UTI.  She has a history of postcoital UTIs and has been treated with intermittent chronic Macrobid which typically resolves her infections.  Today she also developed some vaginal itchiness. No recent changes in sex partners.  No fever, chills, abdominal pain, nausea, or vomiting.  Past Medical History:  Diagnosis Date  . Anxiety   . Palpitations     Patient Active Problem List   Diagnosis Date Noted  . Palpitations 01/02/2011    Past Surgical History:  Procedure Laterality Date  . THYROID CYST EXCISION  1981    OB History    Gravida  0   Para  0   Term  0   Preterm  0   AB  0   Living  0     SAB  0   TAB  0   Ectopic  0   Multiple  0   Live Births               Home Medications    Prior to Admission medications   Medication Sig Start Date End Date Taking? Authorizing Provider  benzonatate (TESSALON PERLES) 100 MG capsule Take 1-2 capsules (100-200 mg total) by mouth every 8 (eight) hours as needed for cough. 03/03/18   Withrow, Elyse Jarvis, FNP  cephALEXin (KEFLEX) 500 MG capsule Take 1 capsule (500 mg total) 4 (four) times daily by mouth. 02/02/17   Hassell Done, Mary-Margaret, FNP  cetirizine (ZYRTEC) 10 MG tablet Take 10 mg daily by mouth.    [provider]  ciprofloxacin (CIPRO) 500 MG tablet Take 1 tablet (500 mg total) by mouth 2 (two) times daily. 08/24/17   Hassell Done, Mary-Margaret, FNP   fluticasone (FLONASE) 50 MCG/ACT nasal spray Place 1 spray into both nostrils 2 (two) times daily. 03/03/18   Withrow, Elyse Jarvis, FNP  Homeopathic Products (HYLAFEM) SUPP Place 1 suppository vaginally at bedtime. 01/14/15   Regina Eck, CNM  ibuprofen (ADVIL,MOTRIN) 800 MG tablet Take 800 mg by mouth every 8 (eight) hours as needed for pain.     [provider]  metroNIDAZOLE (METROGEL) 0.75 % vaginal gel Place one applicatorful in vagina once daily for five days 01/30/17   Kandra Nicolas, MD  Multiple Vitamins-Minerals (MULTIVITAMIN WITH MINERALS) tablet Take 1 tablet by mouth daily.    [provider]  Norethin-Eth Estrad-Fe Biphas (LO LOESTRIN FE PO) Take 1 mg by mouth.    [provider]  Probiotic Product (PROBIOTIC PO) Take 1 tablet by mouth daily as needed.    [provider]  vitamin B-12 (CYANOCOBALAMIN) 1000 MCG tablet Take 1,000 mcg by mouth daily.    [provider]    Family History Family History  Problem Relation Age of Onset  . Stroke Maternal Grandmother   . Diabetes Maternal Grandmother   . Hypertension Maternal Grandmother   . Diabetes Father   . Hypertension Father   .  Stroke Paternal Grandmother   . Hypertension Paternal Grandmother   . Diabetes Paternal Grandfather   . Heart attack Paternal Grandfather   . Hypertension Paternal Grandfather     Social History Social History   Tobacco Use  . Smoking status: Never Smoker  . Smokeless tobacco: Never Used  Substance Use Topics  . Alcohol use: Yes    Alcohol/week: 1.0 standard drinks    Types: 1 Standard drinks or equivalent per week  . Drug use: No     Allergies   Patient has no known allergies.   Review of Systems Review of Systems Pertinent negatives listed in HPI  Physical Exam Triage Vital Signs ED Triage Vitals  Enc Vitals Group     BP 04/12/18 1825 (!) 137/97     Pulse Rate 04/12/18 1825 80     Resp 04/12/18 1825 20     Temp 04/12/18 1825  98.3 F (36.8 C)     Temp Source 04/12/18 1825 Oral     SpO2 04/12/18 1825 100 %     Weight 04/12/18 1828 169 lb (76.7 kg)     Height 04/12/18 1828 5\' 8"  (1.727 m)     Head Circumference --      Peak Flow --      Pain Score 04/12/18 1828 2     Pain Loc --      Pain Edu? --      Excl. in [place]? --    No data found.  Updated Vital Signs BP (!) 137/97 (BP Location: Right Arm)   Pulse 80   Temp 98.3 F (36.8 C) (Oral)   Resp 20   Ht 5\' 8"  (1.727 m)   Wt 169 lb (76.7 kg)   LMP 04/09/2018   SpO2 100%   BMI 25.70 kg/m   Visual Acuity Right Eye Distance:   Left Eye Distance:   Bilateral Distance:    Right Eye Near:   Left Eye Near:    Bilateral Near:     Physical Exam General appearance: alert, well developed, well nourished, cooperative and in no distress Head: Normocephalic, without obvious abnormality, atraumatic Respiratory: Respirations even and unlabored, normal respiratory rate Heart: Regular rate and rhythm.  No murmurs or gallops. Abdominal: BSx 4, negative CVA tenderness, rebounding or guarding  Extremities: No gross deformities Skin: Skin color, texture, turgor normal. No rashes seen  Psych: Appropriate mood and affect. Neurologic: Mental status: Alert, oriented to person, place, and time, thought content appropriate.  UC Treatments / Results  Labs (all labs ordered are listed, but only abnormal results are displayed) Labs Reviewed  URINE CULTURE    EKG None  Radiology No results found.  Procedures Procedures (including critical care time)  Medications Ordered in UC Medications - No data to display  Initial Impression / Assessment and Plan / UC Course  I have reviewed the triage vital signs and the nursing notes.  Pertinent labs & imaging results that were available during my care of the patient were reviewed by me and considered in my medical decision making (see chart for details).   Treating for a persistent acute UTI. Will trial a different  antibiotic given complete resolution of UTI has not been achieved with Macrobid.  Start Ciprofloxacin 500 mg BID x 10 days. Urine culture pending as patient has recently taken AZO which decreases the specificity of urine dipstick analysis.   Final Clinical Impressions(s) / UC Diagnoses   Final diagnoses:  Dysuria  Acute cystitis without hematuria  Discharge Instructions   None    ED Prescriptions    Medication Sig Dispense Auth. Provider   ciprofloxacin (CIPRO) 500 MG tablet Take 1 tablet (500 mg total) by mouth 2 (two) times daily. 14 tablet Scot Jun, FNP   fluconazole (DIFLUCAN) 150 MG tablet Take 1 tablet (150 mg total) by mouth once for 1 dose. May repeat in 3 days if needed. 2 tablet Scot Jun, FNP     Controlled Substance Prescriptions [place] Controlled Substance Registry consulted? Not Applicable   Scot Jun, FNP 04/14/18 1131

## 2018-04-13 LAB — URINE CULTURE
MICRO NUMBER:: 71357
RESULT: NO GROWTH
SPECIMEN QUALITY:: ADEQUATE

## 2018-04-17 ENCOUNTER — Telehealth: Payer: Self-pay | Admitting: *Deleted

## 2018-04-17 MED ORDER — FLUCONAZOLE 150 MG PO TABS: 150.0000 mg | ORAL_TABLET | Freq: Once | ORAL | 1 refills | Status: AC

## 2018-04-17 MED FILL — FLUCONAZOLE 150 MG TABS: 150 | 1 days supply | Qty: 1 | Fill #0

## 2018-04-17 NOTE — Telephone Encounter (Signed)
Patient reports she took the diflucan. She still has another 3 days of her antibiotic and has itching. Refill sent.

## 2018-04-26 MED FILL — NITROFURANTOIN MONO-MCR 100: 100 | 25 days supply | Qty: 30 | Fill #0

## 2018-04-27 ENCOUNTER — Encounter: Payer: Self-pay | Admitting: Emergency Medicine

## 2018-04-27 ENCOUNTER — Other Ambulatory Visit (HOSPITAL_COMMUNITY)
Admission: RE | Admit: 2018-04-27 | Discharge: 2018-04-27 | Disposition: A | Discharge status: Home / Self Care | Payer: Commercial Managed Care - PPO | Source: Ambulatory Visit | Attending: Family Medicine | Admitting: Family Medicine

## 2018-04-27 ENCOUNTER — Emergency Department (INDEPENDENT_AMBULATORY_CARE_PROVIDER_SITE_OTHER)
Admission: EM | Admit: 2018-04-27 | Discharge: 2018-04-27 | Disposition: A | Discharge status: Home / Self Care | Payer: Commercial Managed Care - PPO | Source: Home / Self Care

## 2018-04-27 DIAGNOSIS — R3915 Urgency of urination: Secondary | ICD-10-CM

## 2018-04-27 DIAGNOSIS — R300 Dysuria: Secondary | ICD-10-CM

## 2018-04-27 LAB — POCT URINALYSIS DIP (MANUAL ENTRY)
Bilirubin, UA: NEGATIVE
Blood, UA: NEGATIVE
Glucose, UA: NEGATIVE mg/dL
Ketones, POC UA: NEGATIVE mg/dL
Leukocytes, UA: NEGATIVE
Nitrite, UA: NEGATIVE
Protein Ur, POC: NEGATIVE mg/dL
Spec Grav, UA: 1.01 (ref 1.010–1.025)
Urobilinogen, UA: 0.2 E.U./dL
pH, UA: 5.5 (ref 5.0–8.0)

## 2018-04-27 MED ORDER — METRONIDAZOLE 500 MG PO TABS: 500.0000 mg | ORAL_TABLET | Freq: Two times a day (BID) | ORAL | 0 refills | Status: DC

## 2018-04-27 MED ORDER — METRONIDAZOLE 1.3 % VA GEL: 1.0000 "application " | Freq: Every day | VAGINAL | 1 refills | Status: DC

## 2018-04-27 NOTE — Discharge Instructions (Addendum)
The urine analysis is completely normal.  We will run a culture and vaginal swab test for BV but in the meantime, we will start Metronidazole to cover for BV  You can continue the Azo medicine until we have those tests back.  Another possible cause is interstitial cystitis, an inflammatory condition that simulates a UTI

## 2018-04-27 NOTE — ED Triage Notes (Signed)
Patient c/o frequency, urgency over the past month, been treated with different antbs, after intercourse on Thursday, sx's reappeared.

## 2018-04-27 NOTE — ED Provider Notes (Signed)
[facility]    CSN: [id number] Arrival date & time: 04/27/18  0919     History   Chief Complaint Chief Complaint  Patient presents with  . Dysuria    HPI Megan Rios is a 43 y.o. female.   This a 43 year old woman comes in for the third time in a month with symptoms of urinary tract infection.  She was originally put on Macrobid, and subsequently put on Cipro.  Her culture done 2 weeks ago did not grow any bacteria.  Note from 04/12/18: Treated for UTI through an [facility] on 03/30/2018 with Macrobid. Symptoms of dysuria, frequency, bladder spasms, slightly improved after antibiotic therapy but never completely resolved.  On yesterday the symptoms of bladder spasms and urinary frequency developed more intensely.  She treated herself with Azo and presents here today for treatment of a UTI.  She has a history of postcoital UTIs and has been treated with intermittent chronic Macrobid which typically resolves her infections.  Today she also developed some vaginal itchiness. No recent changes in sex partners.  No fever, chills, abdominal pain, nausea, or vomiting.     Past Medical History:  Diagnosis Date  . Anxiety   . Palpitations     Patient Active Problem List   Diagnosis Date Noted  . Palpitations 01/02/2011    Past Surgical History:  Procedure Laterality Date  . THYROID CYST EXCISION  1981    OB History    Gravida  0   Para  0   Term  0   Preterm  0   AB  0   Living  0     SAB  0   TAB  0   Ectopic  0   Multiple  0   Live Births               Home Medications    Prior to Admission medications   Medication Sig Start Date End Date Taking? Authorizing Provider  cetirizine (ZYRTEC) 10 MG tablet Take 10 mg daily by mouth.    [provider]  fluticasone (FLONASE) 50 MCG/ACT nasal spray Place 1 spray into both nostrils 2 (two) times daily. 03/03/18   Withrow, Elyse Jarvis, FNP  ibuprofen (ADVIL,MOTRIN) 800 MG tablet Take 800 mg by  mouth every 8 (eight) hours as needed for pain.     [provider]  metroNIDAZOLE (FLAGYL) 500 MG tablet Take 1 tablet (500 mg total) by mouth 2 (two) times daily. 04/27/18   Robyn Haber, MD  Multiple Vitamins-Minerals (MULTIVITAMIN WITH MINERALS) tablet Take 1 tablet by mouth daily.    [provider]  Probiotic Product (PROBIOTIC PO) Take 1 tablet by mouth daily as needed.    [provider]    Family History Family History  Problem Relation Age of Onset  . Stroke Maternal Grandmother   . Diabetes Maternal Grandmother   . Hypertension Maternal Grandmother   . Diabetes Father   . Hypertension Father   . Stroke Paternal Grandmother   . Hypertension Paternal Grandmother   . Diabetes Paternal Grandfather   . Heart attack Paternal Grandfather   . Hypertension Paternal Grandfather     Social History Social History   Tobacco Use  . Smoking status: Never Smoker  . Smokeless tobacco: Never Used  Substance Use Topics  . Alcohol use: Yes    Alcohol/week: 1.0 standard drinks    Types: 1 Standard drinks or equivalent per week  . Drug use: No  Allergies   Patient has no known allergies.   Review of Systems Review of Systems   Physical Exam Triage Vital Signs ED Triage Vitals  Enc Vitals Group     BP      Pulse      Resp      Temp      Temp src      SpO2      Weight      Height      Head Circumference      Peak Flow      Pain Score      Pain Loc      Pain Edu?      Excl. in [place]?    No data found.  Updated Vital Signs BP 130/84 (BP Location: Right Arm)   Pulse 78   Temp 98.1 F (36.7 C) (Oral)   Ht 5' 8.75" (1.746 m)   Wt 74 kg   LMP 04/09/2018   SpO2 100%   BMI 24.28 kg/m    Physical Exam Vitals signs and nursing note reviewed.  Constitutional:      Appearance: Normal appearance. She is normal weight.  HENT:     Head: Normocephalic.  Pulmonary:     Effort: Pulmonary effort is normal.  Abdominal:     Comments:  Mild suprapubic discomfort with palpation  Musculoskeletal: Normal range of motion.  Skin:    General: Skin is warm and dry.  Neurological:     General: No focal deficit present.     Mental Status: She is alert.  Psychiatric:        Mood and Affect: Mood normal.      UC Treatments / Results  Labs (all labs ordered are listed, but only abnormal results are displayed) Labs Reviewed  POCT URINALYSIS DIP (MANUAL ENTRY) - Abnormal; Notable for the following components:      Result Value   Color, UA light yellow (*)    All other components within normal limits  URINE CULTURE  CERVICOVAGINAL ANCILLARY ONLY    EKG None  Radiology No results found.  Procedures Procedures (including critical care time)  Medications Ordered in UC Medications - No data to display  Initial Impression / Assessment and Plan / UC Course  I have reviewed the triage vital signs and the nursing notes.  Pertinent labs & imaging results that were available during my care of the patient were reviewed by me and considered in my medical decision making (see chart for details).     Final Clinical Impressions(s) / UC Diagnoses   Final diagnoses:  Urgency of urination  Dysuria     Discharge Instructions     The urine analysis is completely normal.  We will run a culture and vaginal swab test for BV but in the meantime, we will start Metronidazole to cover for BV  You can continue the Azo medicine until we have those tests back.  Another possible cause is interstitial cystitis, an inflammatory condition that simulates a UTI    ED Prescriptions    Medication Sig Dispense Auth. Provider   metroNIDAZOLE (FLAGYL) 500 MG tablet Take 1 tablet (500 mg total) by mouth 2 (two) times daily. 14 tablet Robyn Haber, MD     Controlled Substance Prescriptions [place] Controlled Substance Registry consulted? Not Applicable   Robyn Haber, MD 04/27/18 1006

## 2018-04-29 ENCOUNTER — Telehealth: Payer: Self-pay | Admitting: *Deleted

## 2018-04-29 LAB — URINE CULTURE
MICRO NUMBER:: 139451
Result:: NO GROWTH
SPECIMEN QUALITY:: ADEQUATE

## 2018-04-29 NOTE — Telephone Encounter (Signed)
Contacted patient and advised urine culture is normal. Pt states not much improvement and plans to make an appointment with her PCP.

## 2018-04-30 LAB — CERVICOVAGINAL ANCILLARY ONLY
Bacterial vaginitis: NEGATIVE
Candida vaginitis: NEGATIVE
Chlamydia: NEGATIVE
Neisseria Gonorrhea: NEGATIVE
Trichomonas: NEGATIVE

## 2018-05-01 ENCOUNTER — Telehealth: Payer: Self-pay

## 2018-05-01 NOTE — Telephone Encounter (Signed)
Left message to call UC for lab results.

## 2018-05-02 NOTE — Telephone Encounter (Signed)
Left message to call for results

## 2019-01-07 MED FILL — NITROFURANTOIN MONO-MCR 100: 100 | 30 days supply | Qty: 30 | Fill #0

## 2019-08-06 DIAGNOSIS — Z1231 Encounter for screening mammogram for malignant neoplasm of breast: Secondary | ICD-10-CM | POA: Diagnosis not present

## 2019-08-06 DIAGNOSIS — N920 Excessive and frequent menstruation with regular cycle: Secondary | ICD-10-CM | POA: Diagnosis not present

## 2019-08-06 DIAGNOSIS — Z114 Encounter for screening for human immunodeficiency virus [HIV]: Secondary | ICD-10-CM | POA: Diagnosis not present

## 2019-08-06 DIAGNOSIS — N760 Acute vaginitis: Secondary | ICD-10-CM | POA: Diagnosis not present

## 2019-08-06 DIAGNOSIS — Z01419 Encounter for gynecological examination (general) (routine) without abnormal findings: Secondary | ICD-10-CM | POA: Diagnosis not present

## 2019-08-06 DIAGNOSIS — Z1151 Encounter for screening for human papillomavirus (HPV): Secondary | ICD-10-CM | POA: Diagnosis not present

## 2019-08-06 DIAGNOSIS — D259 Leiomyoma of uterus, unspecified: Secondary | ICD-10-CM | POA: Diagnosis not present

## 2019-08-06 DIAGNOSIS — Z113 Encounter for screening for infections with a predominantly sexual mode of transmission: Secondary | ICD-10-CM | POA: Diagnosis not present

## 2019-08-06 DIAGNOSIS — Z1159 Encounter for screening for other viral diseases: Secondary | ICD-10-CM | POA: Diagnosis not present

## 2019-08-06 DIAGNOSIS — N930 Postcoital and contact bleeding: Secondary | ICD-10-CM | POA: Diagnosis not present

## 2019-08-06 DIAGNOSIS — Z6823 Body mass index (BMI) 23.0-23.9, adult: Secondary | ICD-10-CM | POA: Diagnosis not present

## 2019-08-21 DIAGNOSIS — N83201 Unspecified ovarian cyst, right side: Secondary | ICD-10-CM | POA: Diagnosis not present

## 2019-08-21 DIAGNOSIS — R102 Pelvic and perineal pain: Secondary | ICD-10-CM | POA: Diagnosis not present

## 2019-08-21 DIAGNOSIS — N941 Unspecified dyspareunia: Secondary | ICD-10-CM | POA: Diagnosis not present

## 2019-08-26 MED FILL — IBUPROFEN 800 MG TAB: 800 | 10 days supply | Qty: 30 | Fill #0

## 2019-08-26 MED FILL — LARIN 21 1-20 TABLET: 1-20 | 21 days supply | Qty: 21 | Fill #0

## 2019-08-28 ENCOUNTER — Telehealth: Payer: Self-pay | Admitting: *Deleted

## 2019-08-28 NOTE — Telephone Encounter (Signed)
Called and spoke with the patient regarding her referral. Gave the patient the address and phone number for the clinic. Gave the policy for parking, mask and visitors

## 2019-08-29 ENCOUNTER — Other Ambulatory Visit: Payer: Self-pay

## 2019-08-31 NOTE — Progress Notes (Addendum)
GYNECOLOGIC ONCOLOGY NEW PATIENT CONSULTATION   Patient Name: Megan Rios  Patient Age: 44 y.o. Date of Service: 09/01/19 Referring Provider: Dr. Tiana Loft  Primary Care Provider: Patient, No Pcp Per Consulting Provider: Jeral Pinch, MD   Assessment/Plan:  Premenopausal patient with long history of pelvic pain now with 2 right ovarian cysts, one simple appearing but with a papillary projection, in the setting of an elevated CA-125.  After reviewing her GYN and menstrual history, the patient and I discussed ultrasound findings from her recent scan in May.  Within the right ovary, there is a description of a mixed echogenic mass with calcifications that measures just over 2.5 cm.  Additionally there is a simple appearing cyst with a papillary projection that measures 1.6 cm.  While I cannot see the images, the ultrasonographer and reading physician comment that the echogenic mass appears possibly consistent with a dermoid.  We discussed ultrasound features that raise the concern or suspicion for malignancy including complexity, mural nodularity, papillary projections, and septations.  Overall, the description of her cyst is reassuring and and my suspicion for malignancy is quite low.  In terms of the CA-125, we discussed that this is neither sensitive nor specific test.  This tumor marker may be elevated and multiple other disease processes that are not related to a malignancy, such as uterine fibroids and endometriosis.  Given her history of pelvic pain that worsens with menses, it is possible that the patient has a diagnosis of endometriosis.  Additionally, she has multiple uterine fibroids, both of which could cause her Ca1 25 to be elevated.  The patient understands that without pathologic review, there is no way to obtain a definitive diagnosis.  At this time, given my low suspicion for any malignant process, my recommendation is for close imaging surveillance.  I recommend that we  repeat an ultrasound in 3 months.  We discussed the benefit of performing an ultrasound here so that I can see the images versus at [facility] where she had her recent ultrasound.  I would prefer to repeat the ultrasound where it was recently done so that images can be compared.  I will asked that a copy of this ultrasound be sent to me for review.  In the meantime, the patient is going to start OCPs again with the start of her next cycle.  I recommended that she could try taking these continuously for 3 months to see if this changes anything from a hormonal management standpoint.  All the patient's questions as well as her fianc's were answered today.  I strongly encourage the patient to meet with either a gynecologist who specializes in infertility or to see an REI physician.  She has a diagnosis of primary infertility given the number of months that she has been having intercourse without protection and has not achieved pregnancy.   A copy of this note was sent to the patient's referring provider.   50 minutes of total time was spent for this patient encounter, including preparation, face-to-face counseling with the patient and coordination of care, and documentation of the encounter.   Jeral Pinch, MD  Division of Gynecologic Oncology  Department of Obstetrics and Gynecology  [facility]  ___________________________________________  Chief Complaint: Chief Complaint  Patient presents with  . Cyst of right ovary  . Elevated cancer antigen 125 (CA 125)    History of Present Illness:  Megan Rios is a 44 y.o. y.o. female who is seen in consultation  at the request of No ref. provider found for an evaluation of an adnexal mass.  Patient seen at the end of May with history of abnormal uterine bleeding.  She also describes intermittent pelvic pain that worsens with her menses.  She is not currently on any hormonal birth control, was on oral contraceptive  pills in the past.  Patient has a long history of pelvic pain, dyspareunia that she describes today.  It has been several years since she has been on oral contraceptive pills.  In the past she has used OCPs, NuvaRing, and patch for birth control and hormonal regulation.  Previously, she will do well for 6 months on birth control pills which she takes in a cyclic manner but then she will began having breakthrough bleeding.  She describes this as bleeding outside of the time that she is taking the sugar pills.  Off of birth control, she reports always having normal menses.  Currently her cycles are occurring about every 23 days and she bleeds for 6 days.  She was previously on Loestrin which she tolerated well from a nausea perspective.  Although not taking them continuously, she went a number of months without a cycle on Loestrin and then would have intermittent months of bleeding.  She is always wanted children.  She is with her partner and fianc today and they have been together for the last 8 years.  While they describe not officially having tried to become pregnant, they have spent multiple years having intercourse off of any sort of birth control.  The patient works at [employer].  PAST MEDICAL HISTORY:  Past Medical History:  Diagnosis Date  . Abnormal uterine bleeding   . Anxiety   . Fibroids, intramural   . HSV-1 infection   . Ovarian cyst   . Palpitations      PAST SURGICAL HISTORY:  Past Surgical History:  Procedure Laterality Date  . THYROID CYST EXCISION  1981    OB/GYN HISTORY:  OB History  Gravida Para Term Preterm AB Living  0 0 0 0 0 0  SAB TAB Ectopic Multiple Live Births  0 0 0 0      No LMP recorded.  Age at menarche: 76 Age at menopause: n/a Hx of HRT: n/a Birth Control: Has previously used oral contraceptive pills, patch, and NuvaRing Hx of STDs: h/o HSV1 Last pap: 07/2019, negative History of abnormal pap smears: History of ASCUS Pap  in 2019  SCREENING STUDIES:  Last mammogram: 07/2019  Last colonoscopy: N/A  MEDICATIONS: Outpatient Encounter Medications as of 09/01/2019  Medication Sig  . Cholecalciferol (VITAMIN D3) 125 MCG (5000 UT) CAPS Take 1 capsule by mouth daily.  . fluticasone (FLONASE) 50 MCG/ACT nasal spray Place 1 spray into both nostrils 2 (two) times daily.  Marland Kitchen ibuprofen (ADVIL,MOTRIN) 800 MG tablet Take 800 mg by mouth every 8 (eight) hours as needed for pain.   Marland Kitchen loratadine (CLARITIN) 10 MG tablet Take 10 mg by mouth daily.  . Multiple Vitamins-Minerals (MULTIVITAMIN WITH MINERALS) tablet Take 1 tablet by mouth daily.  . nitrofurantoin, macrocrystal-monohydrate, (MACROBID) 100 MG capsule Take 100 mg by mouth as directed.  . Probiotic Product (PROBIOTIC PO) Take 1 tablet by mouth daily as needed.  Marland Kitchen LARIN 1/20 1-20 MG-MCG tablet Take 1 tablet by mouth daily.   No facility-administered encounter medications on file as of 09/01/2019.    ALLERGIES:  No Known Allergies   FAMILY HISTORY:  Family History  Problem Relation Age  of Onset  . Stroke Maternal Grandmother   . Diabetes Maternal Grandmother   . Hypertension Maternal Grandmother   . Diabetes Father   . Hypertension Father   . Stroke Paternal Grandmother   . Hypertension Paternal Grandmother   . Diabetes Paternal Grandfather   . Heart attack Paternal Grandfather   . Hypertension Paternal Grandfather   . Prostate cancer Paternal Uncle   . Ovarian cancer Neg Hx   . Endometrial cancer Neg Hx   . Breast cancer Neg Hx   . Colon cancer Neg Hx      SOCIAL HISTORY:    Social Connections:   . Frequency of Communication with Friends and Family:   . Frequency of Social Gatherings with Friends and Family:   . Attends Religious Services:   . Active Member of Clubs or Organizations:   . Attends Archivist Meetings:   Marland Kitchen Marital Status:     REVIEW OF SYSTEMS:  + Dyspareunia, abnormal bleeding, pelvic pain, urinary frequency Denies  appetite changes, fevers, chills, fatigue, unexplained weight changes. Denies hearing loss, neck lumps or masses, mouth sores, ringing in ears or voice changes. Denies cough or wheezing.  Denies shortness of breath. Denies chest pain or palpitations. Denies leg swelling. Denies abdominal distention, pain, blood in stools, constipation, diarrhea, nausea, vomiting, or early satiety. Denies dysuria, hematuria or incontinence. Denies hot flashes or vaginal discharge.   Denies joint pain, back pain or muscle pain/cramps. Denies itching, rash, or wounds. Denies dizziness, headaches, numbness or seizures. Denies swollen lymph nodes or glands, denies easy bruising or bleeding. Denies anxiety, depression, confusion, or decreased concentration.  Physical Exam:  Vital Signs for this encounter:  Blood pressure (!) 147/86, pulse 96, temperature 98.4 F (36.9 C), temperature source Tympanic, resp. rate 20, height 5' 8.75" (1.746 m), weight 162 lb (73.5 kg), SpO2 100 %. Body mass index is 24.1 kg/m. General: Alert, oriented, no acute distress.  HEENT: Normocephalic, atraumatic. Sclera anicteric.  Chest: Clear to auscultation bilaterally. No wheezes, rhonchi, or rales. Cardiovascular: Regular rate and rhythm, no murmurs, rubs, or gallops.  Abdomen: Normoactive bowel sounds. Soft, nondistended, nontender to palpation. No masses or hepatosplenomegaly appreciated. No palpable fluid wave.  Extremities: Grossly normal range of motion. Warm, well perfused. No edema bilaterally.  Skin: No rashes or lesions.  Lymphatics: No cervical, supraclavicular, or inguinal adenopathy.  GU:  Normal external female genitalia. No lesions. No discharge or bleeding.             Bladder/urethra:  No lesions or masses, well supported bladder             Vagina: Well rugated, no lesions, no discharge or bleeding.             Cervix: Normal appearing, no lesions.             Uterus: Small, mobile, no parametrial involvement or  nodularity.             Adnexa: Some fullness in adnexa just posterior to the uterus and right, smooth.  Rectal: Deferred.  LABORATORY AND RADIOLOGIC DATA:  Outside medical records were reviewed to synthesize the above history, along with the history and physical obtained during the visit.   Lab Results  Component Value Date   WBC 7.7 11/20/2012   HGB 14.8 11/20/2012   HCT 42.9 11/20/2012   PLT 199 11/20/2012   GLUCOSE 99 11/20/2012   NA 137 11/20/2012   K 3.8 11/20/2012   CL 100 11/20/2012  CREATININE 0.78 11/20/2012   BUN 18 11/20/2012   CO2 27 11/20/2012   Pap test 08/08/2019: Negative for intraepithelial lesion, HPV negative.  Ca1 25 on 5/28: 90.9  Ultrasound exam 07/2019: uterus 8.8 x 5.6 x 5.0 cm, anteverted.  Multiple small fibroids noted, intramuscular, the largest measuring up to 2.3 cm.  Endometrial lining 4.8 mm.  Right ovary measures 4.6 x 3.4 x 2.1 cm and contains a mixed, echogenic mass with calcifications measuring 2.7 x 1.9 x 3 cm, possible dermoid.  There is also a double appearing cyst with papillary projections measuring 1.6 x 1.1 cm.  Left ovary normal appearing and measures 3.0 x 2.6 x 1.7 cm.  Ultrasound exam 11/2017: Multiple fibroids again seen again, the largest measuring up to 2.2 cm.  Right ovary contains small echogenic lesion measuring up to 0.4 cm.  Left ovary contains complex multiloculated lesion measuring 3.4 x 2.9 x 3.2 cm.

## 2019-09-01 ENCOUNTER — Inpatient Hospital Stay: Payer: 59 | Attending: Gynecologic Oncology | Admitting: Gynecologic Oncology

## 2019-09-01 ENCOUNTER — Encounter: Payer: Self-pay | Admitting: Gynecologic Oncology

## 2019-09-01 ENCOUNTER — Other Ambulatory Visit: Payer: Self-pay

## 2019-09-01 VITALS — BP 147/86 | HR 96 | Temp 98.4°F | Resp 20 | Ht 68.75 in | Wt 162.0 lb

## 2019-09-01 DIAGNOSIS — F419 Anxiety disorder, unspecified: Secondary | ICD-10-CM | POA: Diagnosis not present

## 2019-09-01 DIAGNOSIS — R002 Palpitations: Secondary | ICD-10-CM | POA: Diagnosis not present

## 2019-09-01 DIAGNOSIS — N83201 Unspecified ovarian cyst, right side: Secondary | ICD-10-CM

## 2019-09-01 DIAGNOSIS — R102 Pelvic and perineal pain: Secondary | ICD-10-CM | POA: Diagnosis not present

## 2019-09-01 DIAGNOSIS — R971 Elevated cancer antigen 125 [CA 125]: Secondary | ICD-10-CM | POA: Diagnosis not present

## 2019-09-01 DIAGNOSIS — N939 Abnormal uterine and vaginal bleeding, unspecified: Secondary | ICD-10-CM | POA: Insufficient documentation

## 2019-09-01 DIAGNOSIS — D251 Intramural leiomyoma of uterus: Secondary | ICD-10-CM | POA: Diagnosis not present

## 2019-09-01 NOTE — Patient Instructions (Signed)
It was a pleasure meeting you today.  We will coordinate with your OB/GYN to repeat an ultrasound in about 3 months and send [place] these results.  If you develop any new or worsening of your symptoms, please do not hesitate to call at [phone].  Otherwise I will follow up with you after your next ultrasound.

## 2019-10-02 MED FILL — NORETHIND-ETH ESTRAD 1-0.02: 1-20 | 21 days supply | Qty: 21 | Fill #1

## 2019-10-22 DIAGNOSIS — B373 Candidiasis of vulva and vagina: Secondary | ICD-10-CM | POA: Diagnosis not present

## 2019-10-22 DIAGNOSIS — N83299 Other ovarian cyst, unspecified side: Secondary | ICD-10-CM | POA: Diagnosis not present

## 2019-10-22 DIAGNOSIS — N898 Other specified noninflammatory disorders of vagina: Secondary | ICD-10-CM | POA: Diagnosis not present

## 2019-10-22 MED FILL — FLUCONAZOLE 150 MG TABS: 150 | 1 days supply | Qty: 1 | Fill #0

## 2019-10-24 MED FILL — NORETHIND-ETH ESTRAD 1-0.02: 1-20 | 21 days supply | Qty: 21 | Fill #2

## 2019-11-14 MED FILL — NORETHIND-ETH ESTRAD 1-0.02: 1-20 | 21 days supply | Qty: 21 | Fill #3

## 2019-11-14 MED FILL — FLUCONAZOLE 150 MG TABS: 150 | 1 days supply | Qty: 1 | Fill #1

## 2019-12-09 MED FILL — NORETHIND-ETH ESTRAD 1-0.02: 1-20 | 21 days supply | Qty: 21 | Fill #0

## 2019-12-11 MED FILL — IBUPROFEN 800 MG TAB: 800 | 10 days supply | Qty: 30 | Fill #0

## 2019-12-30 ENCOUNTER — Other Ambulatory Visit (HOSPITAL_COMMUNITY): Payer: Self-pay | Admitting: Obstetrics and Gynecology

## 2019-12-30 MED FILL — NORETHIND-ETH ESTRAD 1-0.02: 1-20 | 21 days supply | Qty: 21 | Fill #0

## 2020-01-26 ENCOUNTER — Telehealth: Payer: Self-pay | Admitting: *Deleted

## 2020-01-26 NOTE — Telephone Encounter (Signed)
Fax last office note per request from [facility]

## 2020-01-28 ENCOUNTER — Other Ambulatory Visit (HOSPITAL_COMMUNITY): Payer: Self-pay | Admitting: Obstetrics and Gynecology

## 2020-01-28 DIAGNOSIS — N83291 Other ovarian cyst, right side: Secondary | ICD-10-CM | POA: Diagnosis not present

## 2020-01-28 MED FILL — NORETHIND-ETH ESTRAD 1-0.02: 1-20 | 84 days supply | Qty: 84 | Fill #0

## 2020-02-10 ENCOUNTER — Telehealth: Payer: 59 | Admitting: Nurse Practitioner

## 2020-02-10 DIAGNOSIS — B373 Candidiasis of vulva and vagina: Secondary | ICD-10-CM

## 2020-02-10 DIAGNOSIS — B3731 Acute candidiasis of vulva and vagina: Secondary | ICD-10-CM

## 2020-02-10 MED ORDER — FLUCONAZOLE 150 MG PO TABS
150.0000 mg | ORAL_TABLET | Freq: Once | ORAL | 0 refills | Status: AC
Start: 2020-02-10 — End: 2020-02-10

## 2020-02-10 NOTE — Progress Notes (Signed)

## 2020-04-22 ENCOUNTER — Telehealth: Payer: Self-pay | Admitting: Oncology

## 2020-04-22 NOTE — Telephone Encounter (Signed)
Left a message regarding scheduling a follow up appointment with Dr. Berline Lopes.  Requested a return call.

## 2020-05-10 MED FILL — NORETHIND-ETH ESTRAD 1-0.02: 1-20 | 21 days supply | Qty: 21 | Fill #0

## 2020-05-31 MED FILL — NORETHIND-ETH ESTRAD 1-0.02: 1-20 | 84 days supply | Qty: 84 | Fill #1

## 2020-07-21 ENCOUNTER — Other Ambulatory Visit (HOSPITAL_COMMUNITY): Payer: Self-pay

## 2020-07-21 DIAGNOSIS — N941 Unspecified dyspareunia: Secondary | ICD-10-CM | POA: Diagnosis not present

## 2020-07-21 DIAGNOSIS — N898 Other specified noninflammatory disorders of vagina: Secondary | ICD-10-CM | POA: Diagnosis not present

## 2020-07-21 DIAGNOSIS — B373 Candidiasis of vulva and vagina: Secondary | ICD-10-CM | POA: Diagnosis not present

## 2020-07-21 MED ORDER — FLUCONAZOLE 150 MG PO TABS
ORAL_TABLET | ORAL | 0 refills | Status: DC
  Filled 2020-07-21: qty 3, 7d supply, fill #0

## 2020-08-04 ENCOUNTER — Other Ambulatory Visit (HOSPITAL_COMMUNITY): Payer: Self-pay

## 2020-08-04 MED ORDER — NITROFURANTOIN MONOHYD MACRO 100 MG PO CAPS
ORAL_CAPSULE | ORAL | 0 refills | Status: AC
  Filled 2020-08-04: qty 30, 30d supply, fill #0

## 2020-08-27 ENCOUNTER — Other Ambulatory Visit (HOSPITAL_COMMUNITY): Payer: Self-pay

## 2020-08-27 MED FILL — Norethindrone Ace & Ethinyl Estradiol Tab 1 MG-20 MCG: ORAL | 21 days supply | Qty: 21 | Fill #0 | Status: AC

## 2020-09-21 ENCOUNTER — Other Ambulatory Visit (HOSPITAL_COMMUNITY): Payer: Self-pay

## 2020-09-21 MED ORDER — NORETHINDRONE ACET-ETHINYL EST 1-20 MG-MCG PO TABS
ORAL_TABLET | ORAL | 0 refills | Status: AC
  Filled 2020-09-21: qty 84, 84d supply, fill #0

## 2020-10-27 ENCOUNTER — Encounter: Payer: Self-pay | Admitting: Nurse Practitioner

## 2020-10-27 ENCOUNTER — Telehealth: Payer: 59 | Admitting: Nurse Practitioner

## 2020-10-27 DIAGNOSIS — U071 COVID-19: Secondary | ICD-10-CM | POA: Diagnosis not present

## 2020-10-27 MED ORDER — MOLNUPIRAVIR EUA 200MG CAPSULE: 4.0000 | ORAL_CAPSULE | Freq: Two times a day (BID) | ORAL | 0 refills | Status: AC

## 2020-10-27 NOTE — Progress Notes (Signed)
Virtual Visit Consent   Megan Rios, you are scheduled for a virtual visit with Mary-Margaret Hassell Done, [place], a [facility] provider, today.     Just as with appointments in the office, your consent must be obtained to participate.  Your consent will be active for this visit and any virtual visit you may have with one of our providers in the next 365 days.     If you have a MyChart account, a copy of this consent can be sent to you electronically.  All virtual visits are billed to your insurance company just like a traditional visit in the office.    As this is a virtual visit, video technology does not allow for your provider to perform a traditional examination.  This may limit your provider's ability to fully assess your condition.  If your provider identifies any concerns that need to be evaluated in person or the need to arrange testing (such as labs, EKG, etc.), we will make arrangements to do so.     Although advances in technology are sophisticated, we cannot ensure that it will always work on either your end or our end.  If the connection with a video visit is poor, the visit may have to be switched to a telephone visit.  With either a video or telephone visit, we are not always able to ensure that we have a secure connection.     I need to obtain your verbal consent now.   Are you willing to proceed with your visit today? YES   Megan Rios has provided verbal consent on 10/27/2020 for a virtual visit (video or telephone).   Mary-Margaret Hassell Done, FNP   Date: 10/27/2020 11:29 AM   Virtual Visit via Video Note   I, Mary-Margaret Hassell Done, connected with Megan Rios ([id number], Jan 02, 1976) on 10/27/20 at 11:45 AM EDT by a video-enabled telemedicine application and verified that I am speaking with the correct person using two identifiers.  Location: Patient: Virtual Visit Location Patient: Home Provider: Virtual Visit Location Provider: Mobile   I discussed the  limitations of evaluation and management by telemedicine and the availability of in person appointments. The patient expressed understanding and agreed to proceed.    History of Present Illness: Megan Rios is a 45 y.o. who identifies as a female who was assigned female at birth, and is being seen today for covid positive.  HPI: Patient calls in stating that she took 2 covid test yesterday and they were both positive. She developed symptoms of slight sore throat and headache on Sunday. She now has sinus drainage as well. She is very fatigued.  Review of Systems  Constitutional:  Negative for chills and fever.  HENT:  Positive for congestion and sore throat.   Respiratory:  Positive for cough. Negative for sputum production and shortness of breath.   Musculoskeletal:  Negative for myalgias.  Neurological:  Positive for headaches.   Problems:  Patient Active Problem List   Diagnosis Date Noted   Cyst of right ovary 09/01/2019   Elevated cancer antigen 125 (CA 125) 09/01/2019   Palpitations 01/02/2011    Allergies: No Known Allergies Medications:  Current Outpatient Medications:    Cholecalciferol (VITAMIN D3) 125 MCG (5000 UT) CAPS, Take 1 capsule by mouth daily., Disp: , Rfl:    fluconazole (DIFLUCAN) 150 MG tablet, Take one tablet by mouth on days 1, 4 and 7 as needed, Disp: 3 tablet, Rfl: 0   fluticasone (FLONASE) 50 MCG/ACT nasal spray,  Place 1 spray into both nostrils 2 (two) times daily., Disp: 16 g, Rfl: 6   ibuprofen (ADVIL,MOTRIN) 800 MG tablet, Take 800 mg by mouth every 8 (eight) hours as needed for pain. , Disp: , Rfl:    LARIN 1/20 1-20 MG-MCG tablet, Take 1 tablet by mouth daily., Disp: , Rfl:    loratadine (CLARITIN) 10 MG tablet, Take 10 mg by mouth daily., Disp: , Rfl:    Multiple Vitamins-Minerals (MULTIVITAMIN WITH MINERALS) tablet, Take 1 tablet by mouth daily., Disp: , Rfl:    nitrofurantoin, macrocrystal-monohydrate, (MACROBID) 100 MG capsule, Take 100 mg by  mouth as directed., Disp: , Rfl:    nitrofurantoin, macrocrystal-monohydrate, (MACROBID) 100 MG capsule, Take 1 capsule by mouth after intercourse as needed, Disp: 30 capsule, Rfl: 0   norethindrone-ethinyl estradiol (LOESTRIN) 1-20 MG-MCG tablet, TAKE 1 TABLET BY MOUTH ONCE DAILY CONTINUOUSLY, Disp: 84 tablet, Rfl: 0   norethindrone-ethinyl estradiol (LOESTRIN) 1-20 MG-MCG tablet, TAKE 1 TABLET BY MOUTH ONCE DAILY CONTINUOUSLY, Disp: 147 tablet, Rfl: 0   norethindrone-ethinyl estradiol (LOESTRIN) 1-20 MG-MCG tablet, TAKE 1 TABLET BY MOUTH DAILY CONTINUOUS, Disp: 21 tablet, Rfl: 1   Probiotic Product (PROBIOTIC PO), Take 1 tablet by mouth daily as needed., Disp: , Rfl:   Observations/Objective: Patient is well-developed, well-nourished in no acute distress.  Resting comfortably  at home.  Head is normocephalic, atraumatic.  No labored breathing.  Speech is clear and coherent with logical content.  Patient is alert and oriented at baseline.  Voice sounds froggy  Assessment and Plan:  Megan Rios in today with chief complaint of Covid Positive   1. Lab test positive for detection of COVID-19 virus 1. Take meds as prescribed 2. Use a cool mist humidifier especially during the winter months and when heat has been humid. 3. Use saline nose sprays frequently 4. Saline irrigations of the nose can be very helpful if done frequently.  * 4X daily for 1 week*  * Use of a nettie pot can be helpful with this. Follow directions with this* 5. Drink plenty of fluids 6. Keep thermostat turn down low 7.For any cough or congestion  Use plain Mucinex- regular strength or max strength is fine   * Children- consult with Pharmacist for dosing 8. For fever or aces or pains- take tylenol or ibuprofen appropriate for age and weight.  * for fevers greater than 101 orally you may alternate ibuprofen and tylenol every  3 hours.   Meds ordered this encounter  Medications   molnupiravir EUA 200 mg CAPS     Sig: Take 4 capsules (800 mg total) by mouth 2 (two) times daily for 5 days.    Dispense:  40 capsule    Refill:  0    Order Specific Question:   Supervising Provider    Answer:   Noemi Chapel [[id number]]       Follow Up Instructions: I discussed the assessment and treatment plan with the patient. The patient was provided an opportunity to ask questions and all were answered. The patient agreed with the plan and demonstrated an understanding of the instructions.  A copy of instructions were sent to the patient via MyChart.  The patient was advised to call back or seek an in-person evaluation if the symptoms worsen or if the condition fails to improve as anticipated.  Time:  I spent 10 minutes with the patient via telehealth technology discussing the above problems/concerns.    Mary-Margaret Hassell Done, FNP

## 2020-10-27 NOTE — Patient Instructions (Signed)
You are being prescribed MOLNUPIRAVIR for COVID-19 infection.      Please pick up your prescription at: [facility] called into on [address]   Please call the pharmacy or go through the drive through vs going inside if you are picking up the mediation yourself to prevent further spread. If prescribed to a [facility] affiliated pharmacy, a pharmacist will bring the medication out to your car.   ADMINISTRATION INSTRUCTIONS: Take with or without food. Swallow the tablets whole. Don't chew, crush, or break the medications because it might not work as well  For each dose of the medication, you should be taking FOUR tablets at one time, TWICE a day   Finish your full five-day course of Molnupiravir even if you feel better before you're done. Stopping this medication too early can make it less effective to prevent severe illness related to [place].    Molnupiravir is prescribed for YOU ONLY. Don't share it with others, even if they have similar symptoms as you. This medication might not be right for everyone.   Make sure to take steps to protect yourself and others while you're taking this medication in order to get well soon and to prevent others from getting sick with COVID-19.   **If you are of childbearing potential (any gender) - it is advised to not get pregnant while taking this medication and recommended that condoms are used for female partners the next 3 months after taking the medication out of extreme caution    COMMON SIDE EFFECTS: Diarrhea Nausea  Dizziness    If your COVID-19 symptoms get worse, get medical help right away. Call 911 if you experience symptoms such as worsening cough, trouble breathing, chest pain that doesn't go away, confusion, a hard time staying awake, and pale or blue-colored skin. This medication won't prevent all COVID-19 cases from getting worse.

## 2020-12-06 ENCOUNTER — Other Ambulatory Visit (HOSPITAL_COMMUNITY): Payer: Self-pay

## 2020-12-06 DIAGNOSIS — Z6825 Body mass index (BMI) 25.0-25.9, adult: Secondary | ICD-10-CM | POA: Diagnosis not present

## 2020-12-06 DIAGNOSIS — Z1231 Encounter for screening mammogram for malignant neoplasm of breast: Secondary | ICD-10-CM | POA: Diagnosis not present

## 2020-12-06 DIAGNOSIS — Z3041 Encounter for surveillance of contraceptive pills: Secondary | ICD-10-CM | POA: Diagnosis not present

## 2020-12-06 DIAGNOSIS — Z01419 Encounter for gynecological examination (general) (routine) without abnormal findings: Secondary | ICD-10-CM | POA: Diagnosis not present

## 2020-12-06 MED ORDER — NORETHINDRONE ACET-ETHINYL EST 1-20 MG-MCG PO TABS
ORAL_TABLET | ORAL | 3 refills | Status: AC
  Filled 2020-12-06 – 2020-12-22 (×2): qty 84, 84d supply, fill #0
  Filled 2021-04-01: qty 84, 84d supply, fill #1
  Filled 2021-06-30: qty 84, 84d supply, fill #2
  Filled 2021-09-22: qty 84, 84d supply, fill #3

## 2020-12-14 ENCOUNTER — Other Ambulatory Visit (HOSPITAL_COMMUNITY): Payer: Self-pay

## 2020-12-22 ENCOUNTER — Other Ambulatory Visit (HOSPITAL_COMMUNITY): Payer: Self-pay

## 2021-04-01 ENCOUNTER — Other Ambulatory Visit (HOSPITAL_COMMUNITY): Payer: Self-pay

## 2021-05-23 ENCOUNTER — Other Ambulatory Visit (HOSPITAL_COMMUNITY): Payer: Self-pay

## 2021-05-23 DIAGNOSIS — M461 Sacroiliitis, not elsewhere classified: Secondary | ICD-10-CM | POA: Diagnosis not present

## 2021-05-23 MED ORDER — MELOXICAM 15 MG PO TABS
ORAL_TABLET | ORAL | 1 refills | Status: AC
  Filled 2021-05-23: qty 30, 30d supply, fill #0
  Filled 2021-06-24: qty 30, 30d supply, fill #1

## 2021-05-31 DIAGNOSIS — S76311D Strain of muscle, fascia and tendon of the posterior muscle group at thigh level, right thigh, subsequent encounter: Secondary | ICD-10-CM | POA: Diagnosis not present

## 2021-05-31 DIAGNOSIS — S336XXD Sprain of sacroiliac joint, subsequent encounter: Secondary | ICD-10-CM | POA: Diagnosis not present

## 2021-06-09 DIAGNOSIS — S336XXD Sprain of sacroiliac joint, subsequent encounter: Secondary | ICD-10-CM | POA: Diagnosis not present

## 2021-06-09 DIAGNOSIS — S76311D Strain of muscle, fascia and tendon of the posterior muscle group at thigh level, right thigh, subsequent encounter: Secondary | ICD-10-CM | POA: Diagnosis not present

## 2021-06-15 DIAGNOSIS — S336XXD Sprain of sacroiliac joint, subsequent encounter: Secondary | ICD-10-CM | POA: Diagnosis not present

## 2021-06-15 DIAGNOSIS — S76311D Strain of muscle, fascia and tendon of the posterior muscle group at thigh level, right thigh, subsequent encounter: Secondary | ICD-10-CM | POA: Diagnosis not present

## 2021-06-23 DIAGNOSIS — S76311D Strain of muscle, fascia and tendon of the posterior muscle group at thigh level, right thigh, subsequent encounter: Secondary | ICD-10-CM | POA: Diagnosis not present

## 2021-06-23 DIAGNOSIS — S336XXD Sprain of sacroiliac joint, subsequent encounter: Secondary | ICD-10-CM | POA: Diagnosis not present

## 2021-06-24 ENCOUNTER — Other Ambulatory Visit (HOSPITAL_COMMUNITY): Payer: Self-pay

## 2021-06-30 ENCOUNTER — Other Ambulatory Visit (HOSPITAL_COMMUNITY): Payer: Self-pay

## 2021-07-19 ENCOUNTER — Encounter: Payer: Self-pay | Admitting: *Deleted

## 2021-09-22 ENCOUNTER — Other Ambulatory Visit (HOSPITAL_COMMUNITY): Payer: Self-pay

## 2021-12-30 ENCOUNTER — Other Ambulatory Visit (HOSPITAL_COMMUNITY): Payer: Self-pay

## 2021-12-30 MED ORDER — NORETHINDRONE ACET-ETHINYL EST 1-20 MG-MCG PO TABS
1.0000 | ORAL_TABLET | Freq: Every day | ORAL | 0 refills | Status: DC
  Filled 2021-12-30: qty 21, 21d supply, fill #0

## 2022-01-17 ENCOUNTER — Other Ambulatory Visit (HOSPITAL_COMMUNITY): Payer: Self-pay

## 2022-01-17 MED ORDER — NORETHINDRONE ACET-ETHINYL EST 1-20 MG-MCG PO TABS
1.0000 | ORAL_TABLET | Freq: Every day | ORAL | 0 refills | Status: DC
  Filled 2022-01-17: qty 21, 21d supply, fill #0

## 2022-01-23 ENCOUNTER — Other Ambulatory Visit (HOSPITAL_COMMUNITY): Payer: Self-pay

## 2022-01-23 DIAGNOSIS — Z6824 Body mass index (BMI) 24.0-24.9, adult: Secondary | ICD-10-CM | POA: Diagnosis not present

## 2022-01-23 DIAGNOSIS — Z01419 Encounter for gynecological examination (general) (routine) without abnormal findings: Secondary | ICD-10-CM | POA: Diagnosis not present

## 2022-01-23 DIAGNOSIS — Z1231 Encounter for screening mammogram for malignant neoplasm of breast: Secondary | ICD-10-CM | POA: Diagnosis not present

## 2022-01-23 MED ORDER — NORETHINDRONE ACET-ETHINYL EST 1-20 MG-MCG PO TABS
ORAL_TABLET | ORAL | 4 refills | Status: AC
  Filled 2022-01-23 – 2022-02-15 (×2): qty 84, 84d supply, fill #0
  Filled 2022-05-25: qty 84, 84d supply, fill #1
  Filled 2022-08-31: qty 84, 84d supply, fill #2
  Filled 2022-11-30: qty 84, 84d supply, fill #3

## 2022-02-15 ENCOUNTER — Other Ambulatory Visit (HOSPITAL_COMMUNITY): Payer: Self-pay

## 2022-03-16 ENCOUNTER — Other Ambulatory Visit (HOSPITAL_COMMUNITY): Payer: Self-pay

## 2022-03-16 MED ORDER — VALACYCLOVIR HCL 500 MG PO TABS
500.0000 mg | ORAL_TABLET | Freq: Two times a day (BID) | ORAL | 0 refills | Status: AC
  Filled 2022-03-16: qty 30, 15d supply, fill #0

## 2022-05-26 ENCOUNTER — Other Ambulatory Visit (HOSPITAL_COMMUNITY): Payer: Self-pay

## 2022-08-31 ENCOUNTER — Other Ambulatory Visit (HOSPITAL_COMMUNITY): Payer: Self-pay

## 2022-11-30 ENCOUNTER — Other Ambulatory Visit (HOSPITAL_COMMUNITY): Payer: Self-pay

## 2023-03-06 ENCOUNTER — Other Ambulatory Visit (HOSPITAL_COMMUNITY): Payer: Self-pay

## 2023-03-06 MED ORDER — NORETHINDRONE ACET-ETHINYL EST 1-20 MG-MCG PO TABS
ORAL_TABLET | ORAL | 0 refills | Status: DC
  Filled 2023-03-06: qty 42, 42d supply, fill #0

## 2023-03-13 ENCOUNTER — Other Ambulatory Visit (HOSPITAL_COMMUNITY): Payer: Self-pay

## 2023-04-04 ENCOUNTER — Other Ambulatory Visit (HOSPITAL_COMMUNITY): Payer: Self-pay

## 2023-04-04 MED ORDER — NORETHINDRONE ACET-ETHINYL EST 1-20 MG-MCG PO TABS
1.0000 | ORAL_TABLET | Freq: Every day | ORAL | 0 refills | Status: DC
  Filled 2023-04-04: qty 84, 63d supply, fill #0
  Filled 2023-05-24: qty 84, 84d supply, fill #0

## 2023-04-05 ENCOUNTER — Other Ambulatory Visit (HOSPITAL_COMMUNITY): Payer: Self-pay

## 2023-05-24 ENCOUNTER — Other Ambulatory Visit (HOSPITAL_COMMUNITY): Payer: Self-pay

## 2023-05-25 ENCOUNTER — Other Ambulatory Visit (HOSPITAL_COMMUNITY): Payer: Self-pay

## 2023-06-04 ENCOUNTER — Other Ambulatory Visit (HOSPITAL_COMMUNITY): Payer: Self-pay

## 2023-06-04 DIAGNOSIS — Z1329 Encounter for screening for other suspected endocrine disorder: Secondary | ICD-10-CM | POA: Diagnosis not present

## 2023-06-04 DIAGNOSIS — Z124 Encounter for screening for malignant neoplasm of cervix: Secondary | ICD-10-CM | POA: Diagnosis not present

## 2023-06-04 DIAGNOSIS — Z113 Encounter for screening for infections with a predominantly sexual mode of transmission: Secondary | ICD-10-CM | POA: Diagnosis not present

## 2023-06-04 DIAGNOSIS — Z131 Encounter for screening for diabetes mellitus: Secondary | ICD-10-CM | POA: Diagnosis not present

## 2023-06-04 DIAGNOSIS — Z01411 Encounter for gynecological examination (general) (routine) with abnormal findings: Secondary | ICD-10-CM | POA: Diagnosis not present

## 2023-06-04 DIAGNOSIS — Z0000 Encounter for general adult medical examination without abnormal findings: Secondary | ICD-10-CM | POA: Diagnosis not present

## 2023-06-04 DIAGNOSIS — Z114 Encounter for screening for human immunodeficiency virus [HIV]: Secondary | ICD-10-CM | POA: Diagnosis not present

## 2023-06-04 DIAGNOSIS — Z01419 Encounter for gynecological examination (general) (routine) without abnormal findings: Secondary | ICD-10-CM | POA: Diagnosis not present

## 2023-06-04 DIAGNOSIS — Z13220 Encounter for screening for lipoid disorders: Secondary | ICD-10-CM | POA: Diagnosis not present

## 2023-06-04 DIAGNOSIS — Z1159 Encounter for screening for other viral diseases: Secondary | ICD-10-CM | POA: Diagnosis not present

## 2023-06-04 MED ORDER — FLUCONAZOLE 150 MG PO TABS
150.0000 mg | ORAL_TABLET | ORAL | 0 refills | Status: DC
  Filled 2023-06-04: qty 3, 7d supply, fill #0

## 2023-06-04 MED ORDER — NORETHINDRONE ACET-ETHINYL EST 1-20 MG-MCG PO TABS
1.0000 | ORAL_TABLET | Freq: Every day | ORAL | 3 refills | Status: AC
  Filled 2023-06-04: qty 84, 84d supply, fill #0

## 2023-06-04 MED ORDER — NITROFURANTOIN MONOHYD MACRO 100 MG PO CAPS
100.0000 mg | ORAL_CAPSULE | ORAL | 3 refills | Status: AC
  Filled 2023-06-04: qty 30, 30d supply, fill #0
  Filled 2024-04-18: qty 30, 30d supply, fill #1

## 2023-06-04 MED ORDER — VALACYCLOVIR HCL 500 MG PO TABS
500.0000 mg | ORAL_TABLET | Freq: Two times a day (BID) | ORAL | 4 refills | Status: AC
  Filled 2023-06-04: qty 30, 15d supply, fill #0

## 2023-06-05 ENCOUNTER — Other Ambulatory Visit (HOSPITAL_COMMUNITY): Payer: Self-pay

## 2023-06-05 MED ORDER — VITAMIN D (ERGOCALCIFEROL) 50000 UNITS PO CAPS
ORAL_CAPSULE | ORAL | 1 refills | Status: AC
  Filled 2023-06-05: qty 4, 28d supply, fill #0
  Filled 2024-05-02: qty 4, 28d supply, fill #1

## 2023-06-07 ENCOUNTER — Other Ambulatory Visit (HOSPITAL_COMMUNITY): Payer: Self-pay

## 2023-06-26 DIAGNOSIS — N83209 Unspecified ovarian cyst, unspecified side: Secondary | ICD-10-CM | POA: Diagnosis not present

## 2023-07-23 DIAGNOSIS — Z1211 Encounter for screening for malignant neoplasm of colon: Secondary | ICD-10-CM | POA: Diagnosis not present

## 2023-07-23 DIAGNOSIS — Z1212 Encounter for screening for malignant neoplasm of rectum: Secondary | ICD-10-CM | POA: Diagnosis not present

## 2023-07-28 LAB — COLOGUARD: COLOGUARD: NEGATIVE

## 2023-08-11 ENCOUNTER — Other Ambulatory Visit (HOSPITAL_COMMUNITY): Payer: Self-pay

## 2023-10-12 DIAGNOSIS — S93491A Sprain of other ligament of right ankle, initial encounter: Secondary | ICD-10-CM | POA: Diagnosis not present

## 2024-03-30 ENCOUNTER — Telehealth: Admitting: Physician Assistant

## 2024-03-30 DIAGNOSIS — B9689 Other specified bacterial agents as the cause of diseases classified elsewhere: Secondary | ICD-10-CM | POA: Diagnosis not present

## 2024-03-30 DIAGNOSIS — J019 Acute sinusitis, unspecified: Secondary | ICD-10-CM

## 2024-03-31 MED ORDER — AMOXICILLIN-POT CLAVULANATE 875-125 MG PO TABS: 1.0000 | ORAL_TABLET | Freq: Two times a day (BID) | ORAL | 0 refills | Status: AC

## 2024-03-31 NOTE — Progress Notes (Signed)

## 2024-04-08 ENCOUNTER — Other Ambulatory Visit: Payer: Self-pay | Admitting: Nurse Practitioner

## 2024-04-08 DIAGNOSIS — N760 Acute vaginitis: Secondary | ICD-10-CM

## 2024-04-08 MED ORDER — FLUCONAZOLE 150 MG PO TABS: 150.0000 mg | ORAL_TABLET | ORAL | 0 refills | Status: AC

## 2024-04-18 ENCOUNTER — Other Ambulatory Visit (HOSPITAL_COMMUNITY): Payer: Self-pay

## 2024-04-18 MED ORDER — NORETHINDRONE ACET-ETHINYL EST 1-20 MG-MCG PO TABS
1.0000 | ORAL_TABLET | Freq: Every day | ORAL | 4 refills | Status: AC
  Filled 2024-04-19: qty 84, 84d supply, fill #0

## 2024-04-19 ENCOUNTER — Other Ambulatory Visit (HOSPITAL_COMMUNITY): Payer: Self-pay

## 2024-04-20 ENCOUNTER — Other Ambulatory Visit (HOSPITAL_COMMUNITY): Payer: Self-pay

## 2024-04-21 ENCOUNTER — Other Ambulatory Visit (HOSPITAL_COMMUNITY): Payer: Self-pay

## 2024-04-22 ENCOUNTER — Other Ambulatory Visit: Payer: Self-pay

## 2024-04-22 ENCOUNTER — Other Ambulatory Visit (HOSPITAL_COMMUNITY): Payer: Self-pay

## 2024-05-02 ENCOUNTER — Other Ambulatory Visit (HOSPITAL_COMMUNITY): Payer: Self-pay
